# Patient Record
Sex: Male | Born: 1985 | Race: White | Hispanic: No | Marital: Single | Smoking: Never smoker
Health system: Southern US, Community
[De-identification: ages and names within clinical notes are randomized; demographics above are authoritative.]

## PROBLEM LIST (undated history)

## (undated) DIAGNOSIS — J45909 Unspecified asthma, uncomplicated: Secondary | ICD-10-CM

## (undated) HISTORY — DX: Unspecified asthma, uncomplicated: J45.909

---

## 2013-08-12 ENCOUNTER — Ambulatory Visit (INDEPENDENT_AMBULATORY_CARE_PROVIDER_SITE_OTHER): Payer: BC Managed Care – PPO | Admitting: Family Medicine

## 2013-08-12 ENCOUNTER — Encounter: Payer: Self-pay | Admitting: Family Medicine

## 2013-08-12 VITALS — BP 134/84 | HR 52 | Ht 73.0 in | Wt 199.0 lb

## 2013-08-12 DIAGNOSIS — M24559 Contracture, unspecified hip: Secondary | ICD-10-CM

## 2013-08-12 DIAGNOSIS — M545 Low back pain, unspecified: Secondary | ICD-10-CM | POA: Insufficient documentation

## 2013-08-12 DIAGNOSIS — M6240 Contracture of muscle, unspecified site: Secondary | ICD-10-CM

## 2013-08-12 DIAGNOSIS — M999 Biomechanical lesion, unspecified: Secondary | ICD-10-CM

## 2013-08-12 NOTE — Patient Instructions (Signed)
Good to meet you Ice back when hurting 20 minutes Work on hip flexor stretching and hip abductor strengthening.  Hip abductors on wall with heel and butt touching wall bring up 8 inches hold 2 seconds down slow for count of 4 repeat 30 reps daily each side, 2nd 2 sets daily of 30 3 rd week 3 sets daily.  Look at handout on hip flexor.  Vitamin D 2000 IU daily.  Come back again in 3 weeks.

## 2013-08-12 NOTE — Assessment & Plan Note (Signed)
Decision today to treat with OMT was based on Physical Exam  After verbal consent patient was treated with HVLA, ME techniques in thoracic, lumbar and sacral areas  Patient tolerated the procedure well with improvement in symptoms  Patient given exercises, stretches and lifestyle modifications  See medications in patient instructions if given  Patient will follow up in 3 weeks      

## 2013-08-12 NOTE — Assessment & Plan Note (Signed)
Patient's low back pain is secondary to muscle imbalances. Patient has tight hip flexors and weak hip abductor's. Patient given home exercises that could be beneficial and strengthening exercises for the hip abductor's. Patient will try over-the-counter medications. We discussed proper lifting techniques that could be helpful as well. Patient will try these interventions and come back again in 3 weeks.

## 2013-08-12 NOTE — Progress Notes (Signed)
  Shawn Mason D.O. [facility] [address] [place], [place] [address] Phone: [phone] Subjective:     CC: Back pain.   FAO:ZHYQMVHQIOHPI:Subjective [name] is a 28 y.o. male coming in with complaint of lower back pain. Patient has had back pain intimately of the course last 3-4 years. Patient denies any radiation to his legs or any numbness. Patient states that one time when lifting he did notice some clicking in his back and since then has had some discomfort. Patient states when lifting he does not seem to have any trouble but it seems to be worse after the lifting when he has some tightness. Patient denies epistaxis him from doing any activity but sometimes he takes an extra day off from lifting again. Patient has been doing cross fit regularly. Patient denies any fevers or chills or any abnormal weight loss.     Past medical history, social, surgical and family history all reviewed in electronic medical record.   Review of Systems: No headache, visual changes, nausea, vomiting, diarrhea, constipation, dizziness, abdominal pain, skin rash, fevers, chills, night sweats, weight loss, swollen lymph nodes, body aches, joint swelling, muscle aches, chest pain, shortness of breath, mood changes.   Objective Blood pressure 134/84, pulse 52, height 6\' 1"  (1.854 m), weight 199 lb (90.266 kg), SpO2 97.00%.  General: No apparent distress alert and oriented x3 mood and affect normal, dressed appropriately.  HEENT: Pupils equal, extraocular movements intact  Respiratory: Patient's speak in full sentences and does not appear short of breath  Cardiovascular: No lower extremity edema, non tender, no erythema  Skin: Warm dry intact with no signs of infection or rash on extremities or on axial skeleton.  Abdomen: Soft nontender  Neuro: Cranial nerves II through XII are intact, neurovascularly intact in all extremities with 2+ DTRs and 2+ pulses.  Lymph: No lymphadenopathy of posterior or anterior  cervical chain or axillae bilaterally.  Gait normal with good balance and coordination.  MSK:  Non tender with full range of motion and good stability and symmetric strength and tone of shoulders, elbows, wrist, hip, knee and ankles bilaterally.  Back Exam:  Inspection: Unremarkable  Motion: Flexion 45 deg, Extension 45 deg, Side Bending to 45 deg bilaterally,  Rotation to 45 deg bilaterally  SLR laying: Negative  XSLR laying: Negative  Palpable tenderness: Mild tenderness over the L1-L2 area bilaterally in the paraspinal musculature no midline tenderness.. FABER: negative. Sensory change: Gross sensation intact to all lumbar and sacral dermatomes.  Reflexes: 2+ at both patellar tendons, 2+ at achilles tendons, Babinski's downgoing.  Strength at foot  Plantar-flexion: 5/5 Dorsi-flexion: 5/5 Eversion: 5/5 Inversion: 5/5  Leg strength  Quad: 5/5 Hamstring: 5/5 Hip flexor: 5/5 but does have tightness of the hip flexors bilaterally Hip abductors: 3/5 bilateral Gait unremarkable.  Osteopathic findings  Thoracic T5 extended rotated and side bent left  Lumbar L1, L2 flexed rotated and side bent right  Sacrum Left on left   Impression and Recommendations:     This case required medical decision making of moderate complexity.

## 2013-09-04 ENCOUNTER — Ambulatory Visit (INDEPENDENT_AMBULATORY_CARE_PROVIDER_SITE_OTHER): Payer: BC Managed Care – PPO | Admitting: Family Medicine

## 2013-09-04 ENCOUNTER — Encounter: Payer: Self-pay | Admitting: Family Medicine

## 2013-09-04 VITALS — BP 130/84 | HR 52

## 2013-09-04 DIAGNOSIS — M545 Low back pain, unspecified: Secondary | ICD-10-CM

## 2013-09-04 DIAGNOSIS — M999 Biomechanical lesion, unspecified: Secondary | ICD-10-CM

## 2013-09-04 NOTE — Progress Notes (Signed)
  Tawana ScaleZach Mason D.O. [facility] [address] [place], [place] [address] Phone: [phone]([phone] Subjective:     CC: Back pain follow up  BJY:NWGNFAOZHYHPI:Subjective Shawn Mason is a 28 y.o. male coming in for followup of low back pain. Patient was seen previously and had more of hip flexor tightness bilaterally that was causing his back pain. Patient did respond well to osteopathic manipulation given home exercises, anti-inflammatories over-the-counter medications. Patient states he is 70% better than he was previously. Patient states that he can do a long car ride without any significant discomfort. Patient is doing the exercises about 4 times a week and notices a lot of improvement. Denies any new symptoms and is happy with the results. Patient did get vitamin D and has started taking another course of the last week.     Past medical history, social, surgical and family history all reviewed in electronic medical record.   Review of Systems: No headache, visual changes, nausea, vomiting, diarrhea, constipation, dizziness, abdominal pain, skin rash, fevers, chills, night sweats, weight loss, swollen lymph nodes, body aches, joint swelling, muscle aches, chest pain, shortness of breath, mood changes.   Objective Blood pressure 130/84, pulse 52, SpO2 99.00%.  General: No apparent distress alert and oriented x3 mood and affect normal, dressed appropriately.  HEENT: Pupils equal, extraocular movements intact  Respiratory: Patient's speak in full sentences and does not appear short of breath  Cardiovascular: No lower extremity edema, non tender, no erythema  Skin: Warm dry intact with no signs of infection or rash on extremities or on axial skeleton.  Abdomen: Soft nontender  Neuro: Cranial nerves II through XII are intact, neurovascularly intact in all extremities with 2+ DTRs and 2+ pulses.  Lymph: No lymphadenopathy of posterior or anterior cervical chain or axillae bilaterally.  Gait normal  with good balance and coordination.  MSK:  Non tender with full range of motion and good stability and symmetric strength and tone of shoulders, elbows, wrist, hip, knee and ankles bilaterally.  Back Exam:  Inspection: Unremarkable  Motion: Flexion 45 deg, Extension 45 deg, Side Bending to 45 deg bilaterally,  Rotation to 45 deg bilaterally  SLR laying: Negative  XSLR laying: Negative  Palpable tenderness: Mild tenderness over the L1-L2 area \] on right but significantly better than previous exam. FABER: negative. Sensory change: Gross sensation intact to all lumbar and sacral dermatomes.  Reflexes: 2+ at both patellar tendons, 2+ at achilles tendons, Babinski's downgoing.  Strength at foot  Plantar-flexion: 5/5 Dorsi-flexion: 5/5 Eversion: 5/5 Inversion: 5/5  Leg strength  Quad: 5/5 Hamstring: 5/5 Hip flexor: 5/5 but does have tightness of the hip flexors bilaterally Hip abductors: 4/5 bilateral which is improved Gait unremarkable.  Osteopathic findings  Thoracic T5 extended rotated and side bent left  Lumbar L2 flexed rotated and side bent right  Sacrum Left on left   Impression and Recommendations:     This case required medical decision making of moderate complexity.

## 2013-09-04 NOTE — Assessment & Plan Note (Signed)
She is doing extremely better. Patient is doing the exercises on a regular basis which we can tell based on his increase strength of the hip abductor's and less tightness of the hip flexors. Patient will continue with current therapy. Patient will come back again in 5 weeks for further evaluation and likely manipulation.

## 2013-09-04 NOTE — Patient Instructions (Signed)
Good to see you Exercises 3 times a week  Continue all meds. See me in 5 weeks.

## 2013-09-04 NOTE — Assessment & Plan Note (Signed)
Decision today to treat with OMT was based on Physical Exam  After verbal consent patient was treated with HVLA, ME techniques in thoracic, lumbar and sacral areas  Patient tolerated the procedure well with improvement in symptoms  Patient given exercises, stretches and lifestyle modifications  See medications in patient instructions if given  Patient will follow up in 5 weeks    

## 2013-10-09 ENCOUNTER — Encounter: Payer: Self-pay | Admitting: Family Medicine

## 2013-10-09 ENCOUNTER — Ambulatory Visit (INDEPENDENT_AMBULATORY_CARE_PROVIDER_SITE_OTHER): Payer: BC Managed Care – PPO | Admitting: Family Medicine

## 2013-10-09 VITALS — BP 124/80 | HR 58 | Ht 73.0 in | Wt 201.0 lb

## 2013-10-09 DIAGNOSIS — M545 Low back pain, unspecified: Secondary | ICD-10-CM

## 2013-10-09 DIAGNOSIS — M999 Biomechanical lesion, unspecified: Secondary | ICD-10-CM

## 2013-10-09 NOTE — Assessment & Plan Note (Signed)
Decision today to treat with OMT was based on Physical Exam  After verbal consent patient was treated with HVLA, ME techniques in thoracic, lumbar and sacral areas  Patient tolerated the procedure well with improvement in symptoms  Patient given exercises, stretches and lifestyle modifications  See medications in patient instructions if given  Patient will follow up in 6 weeks      

## 2013-10-09 NOTE — Progress Notes (Signed)
  [name] [address] [place], [place] [address] Phone: [phone] Subjective:     CC: Back pain follow up  VFI:EPPIRJJOAC DASHEL Shawn Mason is a 28 y.o. male coming in for followup of low back pain. Patient was seen previously and had more of hip flexor tightness bilaterally that was causing his back pain. Patient did respond well to osteopathic manipulation given home exercises, anti-inflammatories over-the-counter medications. Patient was given phase II exercises he was working on some strengthening exercises after last visit. Patient states overall he is doing significantly better. Patient given home exercise program. Patient denies any new symptoms. Patient states he is having some mild tightness of the last couple days but otherwise no significant pain or discomfort. Resting comfortably.    Past medical history, social, surgical and family history all reviewed in electronic medical record.   Review of Systems: No headache, visual changes, nausea, vomiting, diarrhea, constipation, dizziness, abdominal pain, skin rash, fevers, chills, night sweats, weight loss, swollen lymph nodes, body aches, joint swelling, muscle aches, chest pain, shortness of breath, mood changes.   Objective Blood pressure 124/80, pulse 58, height 6\' 1"  (1.854 m), weight 201 lb (91.173 kg), SpO2 99.00%.  General: No apparent distress alert and oriented x3 mood and affect normal, dressed appropriately.  HEENT: Pupils equal, extraocular movements intact  Respiratory: Patient's speak in full sentences and does not appear short of breath  Cardiovascular: No lower extremity edema, non tender, no erythema  Skin: Warm dry intact with no signs of infection or rash on extremities or on axial skeleton.  Abdomen: Soft nontender  Neuro: Cranial nerves II through XII are intact, neurovascularly intact in all extremities with 2+ DTRs and 2+ pulses.  Lymph: No lymphadenopathy of posterior or  anterior cervical chain or axillae bilaterally.  Gait normal with good balance and coordination.  MSK:  Non tender with full range of motion and good stability and symmetric strength and tone of shoulders, elbows, wrist, hip, knee and ankles bilaterally.  Back Exam:  Inspection: Unremarkable  Motion: Flexion 45 deg, Extension 45 deg, Side Bending to 45 deg bilaterally,  Rotation to 45 deg bilaterally  SLR laying: Negative  XSLR laying: Negative  Palpable tenderness: Mild tenderness over the L1-L2 area  on right but significantly better than previous exam. FABER: negative. Sensory change: Gross sensation intact to all lumbar and sacral dermatomes.  Reflexes: 2+ at both patellar tendons, 2+ at achilles tendons, Babinski's downgoing.  Strength at foot  Plantar-flexion: 5/5 Dorsi-flexion: 5/5 Eversion: 5/5 Inversion: 5/5  Leg strength  Quad: 5/5 Hamstring: 5/5 Hip flexor: 5/5 but does have tightness of the hip flexors bilaterally Hip abductors: 5 out of 5 Gait unremarkable.  Osteopathic findings  Cervical C2 flexed rotated inside that right  Thoracic T5 extended rotated and side bent left  Lumbar L2 flexed rotated and side bent right  Sacrum Left on left   Impression and Recommendations:     This case required medical decision making of moderate complexity.

## 2013-10-09 NOTE — Assessment & Plan Note (Signed)
Patient is doing remarkably well and has been working on the muscle imbalances. Patient encouraged to continue the exercises fairly regularly. We discussed over-the-counter medications that could be beneficial. Patient is to continue with manipulation as long as it continues to help her we'll increase the duration and decrease the frequency. Patient will follow up again in 5-6 weeks for further evaluation and treatment.  Spent greater than 25 minutes with patient face-to-face and had greater than 50% of counseling including as described above in assessment and plan.

## 2013-10-09 NOTE — Patient Instructions (Signed)
Good to see you Continue the exercises 3 times a week.  Make sure after working out to eat within 30 minutes. 3:1 ratio of carbs to protein. See me again in 6 weeks.

## 2013-11-27 ENCOUNTER — Ambulatory Visit (INDEPENDENT_AMBULATORY_CARE_PROVIDER_SITE_OTHER): Payer: BC Managed Care – PPO | Admitting: Family Medicine

## 2013-11-27 ENCOUNTER — Encounter: Payer: Self-pay | Admitting: Family Medicine

## 2013-11-27 VITALS — BP 132/84 | HR 44 | Ht 73.0 in | Wt 201.0 lb

## 2013-11-27 DIAGNOSIS — M999 Biomechanical lesion, unspecified: Secondary | ICD-10-CM

## 2013-11-27 DIAGNOSIS — M545 Low back pain, unspecified: Secondary | ICD-10-CM

## 2013-11-27 DIAGNOSIS — M7702 Medial epicondylitis, left elbow: Secondary | ICD-10-CM

## 2013-11-27 DIAGNOSIS — M7700 Medial epicondylitis, unspecified elbow: Secondary | ICD-10-CM

## 2013-11-27 NOTE — Assessment & Plan Note (Signed)
Patient is doing markedly better. Patient is really having no pain at baseline. Patient can follow up on an as-needed basis for any other osteopathic manipulation therapy. Patient will continue home exercises 3 times a week and continue the over-the-counter medications. Patient will followup like I said previously on an as-needed basis.

## 2013-11-27 NOTE — Assessment & Plan Note (Signed)
Decision today to treat with OMT was based on Physical Exam  After verbal consent patient was treated with HVLA, ME techniques in thoracic, lumbar and sacral areas  Patient tolerated the procedure well with improvement in symptoms  Patient given exercises, stretches and lifestyle modifications  See medications in patient instructions if given  Patient will follow up in 6-8 weeks    

## 2013-11-27 NOTE — Patient Instructions (Signed)
Good to see you as always.  Too easy! Keep up the nutrition For your elbow look at handout and exercises 3 times a week Drop weight to 50% and increase 15% a week for next 3-4 weeks.  Ice after activity for 20 minutes Wear a compression sleeve with activity and 30 minutes afterward, look at [employer] or [employer] Try topical medicine after activity as well.  Come back in 4-6 weeks to check the elbow again

## 2013-11-27 NOTE — Assessment & Plan Note (Signed)
Patient is a golfer's elbow the left elbow. Patient at home exercise, icing protocol, discussed compression as well as topical anti-inflammatories. Patient will try these interventions as well as a home exercise program. Patient will limit weight lifting to 50% increase slowly 50% a week. Patient will follow up in 4-6 weeks.

## 2013-11-27 NOTE — Progress Notes (Signed)
Tawana ScaleZach Karrine Kluttz D.O. [facility] [address] [place], [place] [address] Phone: [phone]([phone] Subjective:     CC: Back pain follow up  NFA:OZHYQMVHQIHPI:Subjective [name] is a 28 y.o. male coming in for followup of low back pain. Patient was seen previously and had more of hip flexor tightness bilaterally that was causing his back pain. Patient did respond well to osteopathic manipulation given home exercises, anti-inflammatories over-the-counter medications. Patient was given phase II exercises he was working on some strengthening exercises after last visit. Patient states overall he is doing significantly better. We also discussed changing post workout nutrition at last visit and hydration. Patient given changes and overall is feeling better.  Patient does come in with a new problem. Patient is describing some left elbow pain. States it is more the medial aspect. Worse after playing golf. Denies any radiation of the arm or any numbness or weakness. Patient states it is painful especially when he tries to lift certain weights. Not started draining readily daily activities. Pain is 6/10 in severity.    Past medical history, social, surgical and family history all reviewed in electronic medical record.   Review of Systems: No headache, visual changes, nausea, vomiting, diarrhea, constipation, dizziness, abdominal pain, skin rash, fevers, chills, night sweats, weight loss, swollen lymph nodes, body aches, joint swelling, muscle aches, chest pain, shortness of breath, mood changes.   Objective There were no vitals taken for this visit.  General: No apparent distress alert and oriented x3 mood and affect normal, dressed appropriately.  HEENT: Pupils equal, extraocular movements intact  Respiratory: Patient's speak in full sentences and does not appear short of breath  Cardiovascular: No lower extremity edema, non tender, no erythema  Skin: Warm dry intact with no signs of infection or rash on  extremities or on axial skeleton.  Abdomen: Soft nontender  Neuro: Cranial nerves II through XII are intact, neurovascularly intact in all extremities with 2+ DTRs and 2+ pulses.  Lymph: No lymphadenopathy of posterior or anterior cervical chain or axillae bilaterally.  Gait normal with good balance and coordination.  MSK:  Non tender with full range of motion and good stability and symmetric strength and tone of shoulders,  wrist, hip, knee and ankles bilaterally.  Elbow: Left Unremarkable to inspection. Range of motion full pronation, supination, flexion, extension. Strength is full to all of the above directions Stable to varus, valgus stress. Negative moving valgus stress test. Mild tenderness to palpation over the medial epicondylar region Ulnar nerve does not sublux. Negative cubital tunnel Tinel's. Contralateral elbow unremarkable Back Exam:  Inspection: Unremarkable  Motion: Flexion 45 deg, Extension 45 deg, Side Bending to 45 deg bilaterally,  Rotation to 45 deg bilaterally  SLR laying: Negative  XSLR laying: Negative  Palpable tenderness: Mild tenderness over the L1-L2 area  on right but significantly better than previous exam. FABER: negative. Sensory change: Gross sensation intact to all lumbar and sacral dermatomes.  Reflexes: 2+ at both patellar tendons, 2+ at achilles tendons, Babinski's downgoing.  Strength at foot  Plantar-flexion: 5/5 Dorsi-flexion: 5/5 Eversion: 5/5 Inversion: 5/5  Leg strength  Quad: 5/5 Hamstring: 5/5 Hip flexor: 5/5 but does have tightness of the hip flexors bilaterally Hip abductors: 5 out of 5 Gait unremarkable.  Osteopathic findings  Cervical C2 flexed rotated inside that right  Thoracic T5 extended rotated and side bent left  Lumbar L2 flexed rotated and side bent right  Sacrum Left on left   Impression and Recommendations:  This case required medical decision making of moderate complexity.

## 2014-01-01 ENCOUNTER — Ambulatory Visit (INDEPENDENT_AMBULATORY_CARE_PROVIDER_SITE_OTHER): Payer: BC Managed Care – PPO | Admitting: Family Medicine

## 2014-01-01 ENCOUNTER — Encounter: Payer: Self-pay | Admitting: Family Medicine

## 2014-01-01 VITALS — BP 108/80 | HR 50 | Ht 73.0 in | Wt 198.0 lb

## 2014-01-01 DIAGNOSIS — M545 Low back pain, unspecified: Secondary | ICD-10-CM

## 2014-01-01 DIAGNOSIS — M6240 Contracture of muscle, unspecified site: Secondary | ICD-10-CM

## 2014-01-01 DIAGNOSIS — M24559 Contracture, unspecified hip: Secondary | ICD-10-CM

## 2014-01-01 DIAGNOSIS — M999 Biomechanical lesion, unspecified: Secondary | ICD-10-CM

## 2014-01-01 NOTE — Assessment & Plan Note (Signed)
Patient back pain seems to be stable overall. I do not think he has worsening though we are not making significant improvement either. Patient continues with the osteopathic manipulation. We discussed continuing home exercises, icing, we discussed proper seated position that could be helpful. We discussed continuing the over-the-counter medications. No imaging is necessary at this time. Patient will increase the duration between visits and will come back in 5-6 weeks.  Spent greater than 25 minutes with patient face-to-face and had greater than 50% of counseling including as described above in assessment and plan.

## 2014-01-01 NOTE — Assessment & Plan Note (Signed)
Discussed with patient again at length. Patient will continue with the stretching. We discussed the importance of proper technique. About the possibility of formal physical therapy which patient declined.

## 2014-01-01 NOTE — Assessment & Plan Note (Signed)
Decision today to treat with OMT was based on Physical Exam  After verbal consent patient was treated with HVLA, ME techniques in thoracic, lumbar and sacral areas  Patient tolerated the procedure well with improvement in symptoms  Patient given exercises, stretches and lifestyle modifications  See medications in patient instructions if given  Patient will follow up in 5-6 weeks    

## 2014-01-01 NOTE — Progress Notes (Signed)
[name] [address] [place], [place] [address] Phone: [phone] Subjective:     CC: Back pain follow up  [id number] Shawn Mason is a 28 y.o. male coming in for followup of low back pain. Patient back pain has been multifactorial including tightness as well as muscle imbalances. Patient has been working on stretching the hip flexors as well as strengthening the hip abductors. Patient has responded well to osteopathic manipulation in the past as well. We have tried changing position at work. Patient states he is doing relatively well. Patient has been able to increase his weights and does not have any pain. Patient though though some discomfort the day after heavy lifting. Otherwise no new symptoms. No nighttime awakening.  Patient also at last visit did have what appeared to be medial epicondyle it is. Patient was given home exercise program, icing protocol, we discussed the possibility of bracing. Patient changed his grip with lifting. Patient states that the pain is almost completely resolved. Patient has some mild discomfort but as long as he wears a compression sleeve with lifting as well as icing afterwards he has no pain the next day. Denies any numbness or weakness. Patient is happy with the results.    Past medical history, social, surgical and family history all reviewed in electronic medical record.   Review of Systems: No headache, visual changes, nausea, vomiting, diarrhea, constipation, dizziness, abdominal pain, skin rash, fevers, chills, night sweats, weight loss, swollen lymph nodes, body aches, joint swelling, muscle aches, chest pain, shortness of breath, mood changes.   Objective Blood pressure 108/80, pulse 50, height  (1.854 m), weight 198 lb (89.812 kg), SpO2 98.00%.  General: No apparent distress alert and oriented x3 mood and affect normal, dressed appropriately.  HEENT: Pupils equal, extraocular movements intact    Respiratory: Patient's speak in full sentences and does not appear short of breath  Cardiovascular: No lower extremity edema, non tender, no erythema  Skin: Warm dry intact with no signs of infection or rash on extremities or on axial skeleton.  Abdomen: Soft nontender  Neuro: Cranial nerves II through XII are intact, neurovascularly intact in all extremities with 2+ DTRs and 2+ pulses.  Lymph: No lymphadenopathy of posterior or anterior cervical chain or axillae bilaterally.  Gait normal with good balance and coordination.  MSK:  Non tender with full range of motion and good stability and symmetric strength and tone of shoulders,  wrist, hip, knee and ankles bilaterally.  Elbow: Left Unremarkable to inspection. Range of motion full pronation, supination, flexion, extension. Strength is full to all of the above directions Stable to varus, valgus stress. Negative moving valgus stress test. Nontender on exam Ulnar nerve does not sublux. Negative cubital tunnel Tinel's. Contralateral elbow unremarkable Back Exam:  Inspection: Unremarkable  Motion: Flexion 45 deg, Extension 45 deg, Side Bending to 45 deg bilaterally,  Rotation to 45 deg bilaterally  SLR laying: Negative  XSLR laying: Negative  Palpable tenderness: Mild tenderness over the L1-L2 area  on right but significantly better than previous exam. FABER: negative. Sensory change: Gross sensation intact to all lumbar and sacral dermatomes.  Reflexes: 2+ at both patellar tendons, 2+ at achilles tendons, Babinski's downgoing.  Strength at foot  Plantar-flexion: 5/5 Dorsi-flexion: 5/5 Eversion: 5/5 Inversion: 5/5  Leg strength  Quad: 5/5 Hamstring: 5/5 Hip flexor: 5/5 with mild improvement in range of motion Hip abductors: 5 out of 5 Gait unremarkable.  Osteopathic findings  Cervical C2 flexed  rotated inside that right C4 flexed rotated and side bent left  Thoracic T5 extended rotated and side bent left  Lumbar L2 flexed  rotated and side bent right  Sacrum Left on left   Impression and Recommendations:     This case required medical decision making of moderate complexity.

## 2014-01-01 NOTE — Patient Instructions (Addendum)
It is good to see you.  Continue the exercises overall.  For the elbow continue the compression and icing afterward.  Otherwise do what you want the tear is healed.  Your back is great overall.  Try to incorporate maybe ankle weights into hip abductors to get more resistance.  Lets space out to 6 weeks.

## 2014-02-05 ENCOUNTER — Encounter: Payer: Self-pay | Admitting: Family Medicine

## 2014-02-05 ENCOUNTER — Ambulatory Visit (INDEPENDENT_AMBULATORY_CARE_PROVIDER_SITE_OTHER): Payer: BC Managed Care – PPO | Admitting: Family Medicine

## 2014-02-05 VITALS — BP 130/80 | HR 80 | Temp 98.4°F | Ht 73.0 in | Wt 197.4 lb

## 2014-02-05 DIAGNOSIS — M999 Biomechanical lesion, unspecified: Secondary | ICD-10-CM

## 2014-02-05 DIAGNOSIS — M545 Low back pain, unspecified: Secondary | ICD-10-CM

## 2014-02-05 DIAGNOSIS — M7700 Medial epicondylitis, unspecified elbow: Secondary | ICD-10-CM

## 2014-02-05 DIAGNOSIS — M7702 Medial epicondylitis, left elbow: Secondary | ICD-10-CM

## 2014-02-05 MED ORDER — NITROGLYCERIN 0.2 MG/HR TD PT24: MEDICATED_PATCH | TRANSDERMAL | Status: DC

## 2014-02-05 NOTE — Assessment & Plan Note (Signed)
Decision today to treat with OMT was based on Physical Exam  After verbal consent patient was treated with HVLA, ME techniques in thoracic, lumbar and sacral areas  Patient tolerated the procedure well with improvement in symptoms  Patient given exercises, stretches and lifestyle modifications  See medications in patient instructions if given  Patient will follow up in 4 weeks    

## 2014-02-05 NOTE — Patient Instructions (Signed)
Good to se eyou For your elbow try nitroglycerin and keep message going.  Ice after working out.  Think of lifting more with thumbs up or overhand grip Nitroglycerin Protocol   Apply 1/4 nitroglycerin patch to affected area daily.  Change position of patch within the affected area every 24 hours.  You may experience a headache during the first 1-2 weeks of using the patch, these should subside.  If you experience headaches after beginning nitroglycerin patch treatment, you may take your preferred over the counter pain reliever.  Another side effect of the nitroglycerin patch is skin irritation or rash related to patch adhesive.  Please notify our office if you develop more severe headaches or rash, and stop the patch.  Tendon healing with nitroglycerin patch may require 12 to 24 weeks depending on the extent of injury.  Men should not use if taking Viagra, Cialis, or Levitra.   Do not use if you have migraines or rosacea.   See me again in 4 weeks.

## 2014-02-05 NOTE — Assessment & Plan Note (Signed)
Patient continues to respond well with his low back pain. I do think he has tight hip flexors are weak hip abductors compared to his strength relatively. Encourage patient to continue to do some stretching of the radial basis. We discussed continuing the over-the-counter medication. Patient is going to avoid certain extension type exercises it can increase the lordosis of his lumbar spine. We discussed proper lifting technique on multiple different meds. Patient and will also limit the number of days that he does powerless. Patient and will come back and see me again in 4 weeks for further evaluation and treatment.

## 2014-02-05 NOTE — Progress Notes (Signed)
[name] [address] [place], [place] [address] Phone: [phone] Subjective:     CC: Back pain follow up, left elbow followup  BJY:NWGNFAOZHY Shawn Mason is a 28 y.o. male coming in for followup of low back pain. Patient back pain has been multifactorial including tightness as well as muscle imbalances. Patient continues to be able to lift significant amount of weight. Patient states that when he does more lifting he does have some mild increase in exaggeration of the back pain. Patient though denies any radiation down the legs any numbness. Patient states that as long as he does his exercises and stretches of her lower back he does well. Patient continues with the over-the-counter medications.   Patient also at last visit did have what appeared to be medial epicondylitis. Patient continues to try to do well with conservative therapy but still has some nagging dull aching sensation on the medial aspect of his elbow. Patient states that she doesn't feel she has made significant improvement over the course of time. Patient is wondering if there is anything else we can do.    Past medical history, social, surgical and family history all reviewed in electronic medical record.   Review of Systems: No headache, visual changes, nausea, vomiting, diarrhea, constipation, dizziness, abdominal pain, skin rash, fevers, chills, night sweats, weight loss, swollen lymph nodes, body aches, joint swelling, muscle aches, chest pain, shortness of breath, mood changes.   Objective Blood pressure 130/80, pulse 80, temperature 98.4 F (36.9 C), temperature source Oral, height 6\' 1"  (1.854 m), weight 197 lb 6.4 oz (89.54 kg), SpO2 97.00%.  General: No apparent distress alert and oriented x3 mood and affect normal, dressed appropriately.  HEENT: Pupils equal, extraocular movements intact  Respiratory: Patient's speak in full sentences and does not appear short of breath    Cardiovascular: No lower extremity edema, non tender, no erythema  Skin: Warm dry intact with no signs of infection or rash on extremities or on axial skeleton.  Abdomen: Soft nontender  Neuro: Cranial nerves II through XII are intact, neurovascularly intact in all extremities with 2+ DTRs and 2+ pulses.  Lymph: No lymphadenopathy of posterior or anterior cervical chain or axillae bilaterally.  Gait normal with good balance and coordination.  MSK:  Non tender with full range of motion and good stability and symmetric strength and tone of shoulders,  wrist, hip, knee and ankles bilaterally.  Elbow: Left Unremarkable to inspection. Range of motion full pronation, supination, flexion, extension. Strength is full to all of the above directions Stable to varus, valgus stress. Negative moving valgus stress test. Still minimally tender to palpation over the medial epicondylar region. Ulnar nerve does not sublux. Negative cubital tunnel Tinel's. Contralateral elbow unremarkable Back Exam:  Inspection: Unremarkable  Motion: Flexion 45 deg, Extension 45 deg, Side Bending to 45 deg bilaterally,  Rotation to 45 deg bilaterally  SLR laying: Negative  XSLR laying: Negative  Palpable tenderness: Mild tenderness over the L1-L2 area  on right but significantly better than previous exam. FABER: negative. Sensory change: Gross sensation intact to all lumbar and sacral dermatomes.  Reflexes: 2+ at both patellar tendons, 2+ at achilles tendons, Babinski's downgoing.  Strength at foot  Plantar-flexion: 5/5 Dorsi-flexion: 5/5 Eversion: 5/5 Inversion: 5/5  Leg strength  Quad: 5/5 Hamstring: 5/5 Hip flexor: 5/5 with mild improvement in range of motion Hip abductors: 5 out of 5 Gait unremarkable.  Osteopathic findings  Cervical C2 flexed rotated inside that right  C4 flexed rotated and side bent left  Thoracic T5 extended rotated and side bent left  Lumbar L2 flexed rotated and side bent  right  Sacrum Left on left   Impression and Recommendations:     This case required medical decision making of moderate complexity.

## 2014-02-05 NOTE — Assessment & Plan Note (Signed)
Patient continues to have difficulty with the golfer's elbow. We discussed proper lifting technique and avoiding significant forearm flexors that is contributing to this pain. We discussed possibly doing a compression sleeve and patient was given nitroglycerin and warned of potential side effects. We discussed an icing regimen. Patient will try to make these different changes and will try the medication and will follow up in 4 weeks for further evaluation.

## 2014-02-05 NOTE — Progress Notes (Signed)
Pre visit review using our clinic review tool, if applicable. No additional management support is needed unless otherwise documented below in the visit note. 

## 2014-03-05 ENCOUNTER — Ambulatory Visit (INDEPENDENT_AMBULATORY_CARE_PROVIDER_SITE_OTHER): Payer: BC Managed Care – PPO | Admitting: Family Medicine

## 2014-03-05 ENCOUNTER — Encounter: Payer: Self-pay | Admitting: Family Medicine

## 2014-03-05 VITALS — BP 118/78 | HR 54 | Ht 73.0 in | Wt 197.0 lb

## 2014-03-05 DIAGNOSIS — M9904 Segmental and somatic dysfunction of sacral region: Secondary | ICD-10-CM

## 2014-03-05 DIAGNOSIS — M9903 Segmental and somatic dysfunction of lumbar region: Secondary | ICD-10-CM

## 2014-03-05 DIAGNOSIS — M9902 Segmental and somatic dysfunction of thoracic region: Secondary | ICD-10-CM

## 2014-03-05 DIAGNOSIS — M7702 Medial epicondylitis, left elbow: Secondary | ICD-10-CM

## 2014-03-05 DIAGNOSIS — M545 Low back pain, unspecified: Secondary | ICD-10-CM

## 2014-03-05 DIAGNOSIS — M999 Biomechanical lesion, unspecified: Secondary | ICD-10-CM

## 2014-03-05 DIAGNOSIS — M24559 Contracture, unspecified hip: Secondary | ICD-10-CM

## 2014-03-05 NOTE — Progress Notes (Signed)
Shawn Mason D.O. [facility] [address] [place], [place] [address] Phone: [phone]) [phone] Subjective:     CC: Back pain follow up, left elbow followup  QIO:[id number]:Subjective Shawn Mason is a 28 y.o. male coming in for followup of low back pain. Patient back pain has been multifactorial including tightness as well as muscle imbalances. Patient continues to be able to lift significant amount of weight. Patient has been doing a lot more squats and has noticed some mild increase in pain. Denies any radiation down his legs and any numbness. Patient states that this does not affect his daily activities. States that it can be sore at the end of the day. Does not keep him up at night. Denies any fevers or chills or any abnormal weight loss.   Patient also at last visit did have what appeared to be medial epicondylitis. Patient continues to try to do well with conservative therapy but still has some nagging dull aching sensation on the medial aspect of his elbow. Patient was put on nitroglycerin at last visit and continued with conservative therapy. Patient states he has made improvement. Patient states he only has some soreness when he starts lifting but overall does not notice any significant pain that stops him from any activity. No pain at all with any regular daily activities. Only pain with full extension of the elbow. No weakness noted.    Past medical history, social, surgical and family history all reviewed in electronic medical record.   Review of Systems: No headache, visual changes, nausea, vomiting, diarrhea, constipation, dizziness, abdominal pain, skin rash, fevers, chills, night sweats, weight loss, swollen lymph nodes, body aches, joint swelling, muscle aches, chest pain, shortness of breath, mood changes.   Objective Blood pressure 118/78, pulse 54, height 6\' 1"  (1.854 m), weight 197 lb (89.359 kg), SpO2 98.00%.  General: No apparent distress alert and oriented x3 mood and  affect normal, dressed appropriately.  HEENT: Pupils equal, extraocular movements intact  Respiratory: Patient's speak in full sentences and does not appear short of breath  Cardiovascular: No lower extremity edema, non tender, no erythema  Skin: Warm dry intact with no signs of infection or rash on extremities or on axial skeleton.  Abdomen: Soft nontender  Neuro: Cranial nerves II through XII are intact, neurovascularly intact in all extremities with 2+ DTRs and 2+ pulses.  Lymph: No lymphadenopathy of posterior or anterior cervical chain or axillae bilaterally.  Gait normal with good balance and coordination.  MSK:  Non tender with full range of motion and good stability and symmetric strength and tone of shoulders,  wrist, hip, knee and ankles bilaterally.  Elbow: Left Unremarkable to inspection. Range of motion full pronation, supination, flexion, extension. Strength is full to all of the above directions Stable to varus, valgus stress. Negative moving valgus stress test. Nontender on exam over the medial epicondylar region. Ulnar nerve does not sublux. Negative cubital tunnel Tinel's. Contralateral elbow unremarkable Back Exam:  Inspection: Unremarkable  Motion: Flexion 45 deg, Extension 45 deg, Side Bending to 45 deg bilaterally,  Rotation to 45 deg bilaterally  SLR laying: Negative  XSLR laying: Negative  Palpable tenderness: Mild tenderness over the L1-L2 area  on right but significantly better than previous exam. FABER: negative. Sensory change: Gross sensation intact to all lumbar and sacral dermatomes.  Reflexes: 2+ at both patellar tendons, 2+ at achilles tendons, Babinski's downgoing.  Strength at foot  Plantar-flexion: 5/5 Dorsi-flexion: 5/5 Eversion: 5/5 Inversion: 5/5  Leg strength  Quad: 5/5 Hamstring: 5/5 Hip flexor: 5/5 with mild improvement in range of motion Hip abductors: 5 out of 5 Gait unremarkable.  Osteopathic findings  Cervical C2 flexed rotated  inside that right C4 flexed rotated and side bent left  Thoracic T3 extended rotated and side bent left T7 extended rotated and side bent right  Lumbar L2 flexed rotated and side bent right  Sacrum Left on left   Impression and Recommendations:     This case required medical decision making of moderate complexity.

## 2014-03-05 NOTE — Assessment & Plan Note (Signed)
Still significantly tight. Encourage patient to do the exercises on a more regular basis. She patient proper technique and was well today. Return to office in 6 weeks

## 2014-03-05 NOTE — Assessment & Plan Note (Signed)
On nitroglycerin at this time. Patient is making good improvement. We discussed the possibility of compression. Patient is not having this affect his regular daily activities or waking up at night and denies any radiation of pain or any numbness.  We'll continue with conservative therapy including icing, home exercises, compression and the nitroglycerin. Patient will follow-up in 6 weeks. Continuing to have pain we can always consider cortical steroid injection or PRP. At follow-up if continuing have pain over the ultrasound the elbow.

## 2014-03-05 NOTE — Assessment & Plan Note (Signed)
Decision today to treat with OMT was based on Physical Exam  After verbal consent patient was treated with HVLA, ME techniques in thoracic, lumbar and sacral areas  Patient tolerated the procedure well with improvement in symptoms  Patient given exercises, stretches and lifestyle modifications  See medications in patient instructions if given  Patient will follow up in 6 weeks      

## 2014-03-05 NOTE — Patient Instructions (Signed)
Good to see you You are dong great For the elbow continue the nitro daily and likely will take another 6 weeks.  Back is great just tight hip flexors still.  Keep focusing on stretching  You are doing great lets say 6 weeks.

## 2014-03-05 NOTE — Assessment & Plan Note (Signed)
Patient's low back pain is still multifactorial being more muscle imbalances and patient's continuing to do exercises. Patient has made different nutritional changes. We discussed other changes can be made to his regular workout routines and talked about specific technique. Showed patient examples proper technique as well. Patient then will continue with the conservative therapy and home exercises and we'll see patient back again in 6 weeks for further evaluation and treatment.

## 2014-04-14 ENCOUNTER — Ambulatory Visit (INDEPENDENT_AMBULATORY_CARE_PROVIDER_SITE_OTHER): Payer: BC Managed Care – PPO | Admitting: Family Medicine

## 2014-04-14 ENCOUNTER — Encounter: Payer: Self-pay | Admitting: Family Medicine

## 2014-04-14 VITALS — BP 110/80 | HR 52 | Ht 73.0 in | Wt 199.0 lb

## 2014-04-14 DIAGNOSIS — M999 Biomechanical lesion, unspecified: Secondary | ICD-10-CM

## 2014-04-14 DIAGNOSIS — M9904 Segmental and somatic dysfunction of sacral region: Secondary | ICD-10-CM

## 2014-04-14 DIAGNOSIS — M545 Low back pain, unspecified: Secondary | ICD-10-CM

## 2014-04-14 DIAGNOSIS — M9902 Segmental and somatic dysfunction of thoracic region: Secondary | ICD-10-CM

## 2014-04-14 DIAGNOSIS — M9903 Segmental and somatic dysfunction of lumbar region: Secondary | ICD-10-CM

## 2014-04-14 NOTE — Patient Instructions (Signed)
Good to see you as always.  Have fun with driving.  [employer] out [employer]. Happy Holidays! See me when you need me.

## 2014-04-14 NOTE — Assessment & Plan Note (Signed)
Decision today to treat with OMT was based on Physical Exam  After verbal consent patient was treated with HVLA, ME techniques in thoracic, lumbar and sacral areas  Patient tolerated the procedure well with improvement in symptoms  Patient given exercises, stretches and lifestyle modifications  See medications in patient instructions if given  Patient will follow up in 6 weeks      

## 2014-04-14 NOTE — Assessment & Plan Note (Signed)
Continues just to be muscle imbalances. Patient continues to respond extremely well to conservative therapy and manipulation therapy. Patient doing so well we'll consider actually spacing of the frequency of appointments. We discussed proper lifting techniques again. We discussed proper stretching techniques. Patient as well as a continues to do well we'll follow up with me again in 6 weeks.

## 2014-04-14 NOTE — Progress Notes (Signed)
  Tawana ScaleZach Mason D.O. [facility] [address] [place], [place] [address] Phone: [phone](336) [phone] Subjective:     CC: Back pain follow up, left elbow followup  BJY:NWGNFAOZHYHPI:Subjective [name] is a 28 y.o. male coming in for followup of low back pain. Patient back pain has been multifactorial including tightness as well as muscle imbalances. Patient does work out regularly. Patient denies any pain that stops him from any activity at this time. Patient states overall his back seems to be going well. A little concerned because patient will be traveling for the next couple weeks. This will take him out of his regular routine and stretches.   Patient also at last visit did have what appeared to be medial epicondylitis. No longer giving him any pain.   Past medical history, social, surgical and family history all reviewed in electronic medical record.   Review of Systems: No headache, visual changes, nausea, vomiting, diarrhea, constipation, dizziness, abdominal pain, skin rash, fevers, chills, night sweats, weight loss, swollen lymph nodes, body aches, joint swelling, muscle aches, chest pain, shortness of breath, mood changes.   Objective Blood pressure 110/80, pulse 52, height 6\' 1"  (1.854 m), weight 199 lb (90.266 kg), SpO2 98 %.  General: No apparent distress alert and oriented x3 mood and affect normal, dressed appropriately.  HEENT: Pupils equal, extraocular movements intact  Respiratory: Patient's speak in full sentences and does not appear short of breath  Cardiovascular: No lower extremity edema, non tender, no erythema  Skin: Warm dry intact with no signs of infection or rash on extremities or on axial skeleton.  Abdomen: Soft nontender  Neuro: Cranial nerves II through XII are intact, neurovascularly intact in all extremities with 2+ DTRs and 2+ pulses.  Lymph: No lymphadenopathy of posterior or anterior cervical chain or axillae bilaterally.  Gait normal with good balance and  coordination.  MSK:  Non tender with full range of motion and good stability and symmetric strength and tone of shoulders,  wrist, hip, knee and ankles bilaterally.  Elbow: Left Unremarkable to inspection. Range of motion full pronation, supination, flexion, extension. Strength is full to all of the above directions Stable to varus, valgus stress. Negative moving valgus stress test. Nontender on exam over the medial epicondylar region. Ulnar nerve does not sublux. Negative cubital tunnel Tinel's. Contralateral elbow unremarkable Back Exam:  Inspection: Unremarkable  Motion: Flexion 45 deg, Extension 35 deg, Side Bending to 35 deg bilaterally,  Rotation to 45 deg bilaterally  SLR laying: Negative  XSLR laying: Negative  Palpable tenderness: Mild tenderness over the L1-L2 area  on right but significantly better than previous exam. FABER: negative. Sensory change: Gross sensation intact to all lumbar and sacral dermatomes.  Reflexes: 2+ at both patellar tendons, 2+ at achilles tendons, Babinski's downgoing.  Strength at foot  Plantar-flexion: 5/5 Dorsi-flexion: 5/5 Eversion: 5/5 Inversion: 5/5  Leg strength  Quad: 5/5 Hamstring: 5/5 Hip flexor: 5/5 with mild improvement in range of motion Hip abductors: 5 out of 5 Gait unremarkable.  Osteopathic findings  Cervical C2 flexed rotated inside that right  Thoracic T3 extended rotated and side bent left T6 extended rotated and side bent right  Lumbar L2 flexed rotated and side bent right  Sacrum Left on left   Impression and Recommendations:     This case required medical decision making of moderate complexity.

## 2014-05-19 ENCOUNTER — Encounter: Payer: Self-pay | Admitting: Family Medicine

## 2014-05-19 ENCOUNTER — Ambulatory Visit (INDEPENDENT_AMBULATORY_CARE_PROVIDER_SITE_OTHER): Payer: BLUE CROSS/BLUE SHIELD | Admitting: Family Medicine

## 2014-05-19 VITALS — BP 118/82 | HR 49 | Ht 73.0 in | Wt 198.0 lb

## 2014-05-19 DIAGNOSIS — M545 Low back pain, unspecified: Secondary | ICD-10-CM

## 2014-05-19 DIAGNOSIS — M9903 Segmental and somatic dysfunction of lumbar region: Secondary | ICD-10-CM

## 2014-05-19 DIAGNOSIS — M9904 Segmental and somatic dysfunction of sacral region: Secondary | ICD-10-CM

## 2014-05-19 DIAGNOSIS — M999 Biomechanical lesion, unspecified: Secondary | ICD-10-CM

## 2014-05-19 DIAGNOSIS — M9902 Segmental and somatic dysfunction of thoracic region: Secondary | ICD-10-CM

## 2014-05-19 NOTE — Assessment & Plan Note (Signed)
Decision today to treat with OMT was based on Physical Exam  After verbal consent patient was treated with HVLA, ME techniques in thoracic, lumbar and sacral areas  Patient tolerated the procedure well with improvement in symptoms  Patient given exercises, stretches and lifestyle modifications  See medications in patient instructions if given  Patient will follow up in 2weeks 

## 2014-05-19 NOTE — Progress Notes (Signed)
  Tawana ScaleZach Delailah Spieth D.O. [facility] [address] [place], [place] [address] Phone: [phone]([phone] Subjective:     CC: Back pain exacerbation  DGL:OVFIEPPIRJHPI:Subjective [name] is a 29 y.o. male coming in for followup of low back pain. Patient does CrossFit avidly. Patient states that on Christmas Eve patient was lifting and just some mild increase in extension on one of his lifts. Patient states following that 2 days he had some soreness. Patient and attempted to do some light lifting and even then gave him some pain. Patient states that he seems to be localized in the lower back. Dull throbbing sensation that occurs intermittently. Denies any radiation down the leg or any numbness or tingling. This does feel somewhat different than previous exam.   Patient also at last visit did have what appeared to be medial epicondylitis. No longer giving him any pain.   Past medical history, social, surgical and family history all reviewed in electronic medical record.   Review of Systems: No headache, visual changes, nausea, vomiting, diarrhea, constipation, dizziness, abdominal pain, skin rash, fevers, chills, night sweats, weight loss, swollen lymph nodes, body aches, joint swelling, muscle aches, chest pain, shortness of breath, mood changes.   Objective Blood pressure 118/82, pulse 49, height 6\' 1"  (1.854 m), weight 198 lb (89.812 kg), SpO2 98 %.  General: No apparent distress alert and oriented x3 mood and affect normal, dressed appropriately.  HEENT: Pupils equal, extraocular movements intact  Respiratory: Patient's speak in full sentences and does not appear short of breath  Cardiovascular: No lower extremity edema, non tender, no erythema  Skin: Warm dry intact with no signs of infection or rash on extremities or on axial skeleton.  Abdomen: Soft nontender  Neuro: Cranial nerves II through XII are intact, neurovascularly intact in all extremities with 2+ DTRs and 2+ pulses.  Lymph: No  lymphadenopathy of posterior or anterior cervical chain or axillae bilaterally.  Gait normal with good balance and coordination.  MSK:  Non tender with full range of motion and good stability and symmetric strength and tone of shoulders,  wrist, hip, knee and ankles bilaterally.  Back Exam:  Inspection: Unremarkable  Motion: Flexion 45 deg, Extension 35 deg, Side Bending to 35 deg bilaterally,  Rotation to 45 deg bilaterally  SLR laying: Negative  XSLR laying: Negative  Palpable tenderness: severe tenderness noted over L4-L5 on the right paraspinal musculature FABER: negative. Sensory change: Gross sensation intact to all lumbar and sacral dermatomes.  Reflexes: 2+ at both patellar tendons, 2+ at achilles tendons, Babinski's downgoing.  Strength at foot  Plantar-flexion: 5/5 Dorsi-flexion: 5/5 Eversion: 5/5 Inversion: 5/5  Leg strength  Quad: 5/5 Hamstring: 5/5 Hip flexor: 5/5 with mild improvement in range of motion Hip abductors: 5 out of 5 Gait unremarkable.  Osteopathic findings  Cervical C2 flexed rotated inside that right  Thoracic T3 extended rotated and side bent left T6 extended rotated and side bent right  Lumbar L2 flexed rotated and side bent left L4 flexed rotated and side bent right  Sacrum Left on left   Impression and Recommendations:     This case required medical decision making of moderate complexity.

## 2014-05-19 NOTE — Patient Instructions (Signed)
Good to see you Try the medicine 2 times daily for at least next day if not 2 times daily.  Good to start lifting again tomorrow. Start 50% then increase 10-15% a week.  Ice after the lifting.  See me again in 2 weeks.

## 2014-05-19 NOTE — Assessment & Plan Note (Signed)
I do believe the patient is having some exacerbation likely did have some rotation of the L4-L5 and S1 area. Think this is more of a backward torsion and likely caused significant amount of discomfort. Patient did respond well to osteopathic manipulation today. We discussed proper lifting technique as well as icing protocol. We discussed home exercises. We discussed avoiding any significant limitation and was given an exercise prescription today on how to increase the weight. Patient come back in 2 weeks. If continuing have difficulty I would like to get x-rays and we may need to consider further evaluation with advanced imaging.

## 2014-06-03 ENCOUNTER — Ambulatory Visit (INDEPENDENT_AMBULATORY_CARE_PROVIDER_SITE_OTHER): Payer: BLUE CROSS/BLUE SHIELD | Admitting: Family Medicine

## 2014-06-03 ENCOUNTER — Encounter: Payer: Self-pay | Admitting: Family Medicine

## 2014-06-03 VITALS — BP 124/78 | HR 45 | Ht 73.0 in | Wt 198.0 lb

## 2014-06-03 DIAGNOSIS — M9903 Segmental and somatic dysfunction of lumbar region: Secondary | ICD-10-CM

## 2014-06-03 DIAGNOSIS — M545 Low back pain, unspecified: Secondary | ICD-10-CM

## 2014-06-03 DIAGNOSIS — M999 Biomechanical lesion, unspecified: Secondary | ICD-10-CM

## 2014-06-03 DIAGNOSIS — M9902 Segmental and somatic dysfunction of thoracic region: Secondary | ICD-10-CM

## 2014-06-03 DIAGNOSIS — M9904 Segmental and somatic dysfunction of sacral region: Secondary | ICD-10-CM

## 2014-06-03 NOTE — Progress Notes (Signed)
  Shawn ScaleZach Mason D.O.  [facility] [address] [place], [place] [address] Phone: [phone](336) [phone] Subjective:     CC: Back pain exacerbation  Shawn Mason is a 29 y.o. male coming in for followup of low back pain. Patient does CrossFit avidly. Patient did have an exacerbation or new injury approximately 3 weeks ago. Patient was seen and had some manipulation and we did do some mother medications. Patient was to return to lifting slowly over the course of the last several weeks. Patient states overall he is doing very well. Patient notices significant decrease in pain approximately 2 days after the last adjustment. Patient has started increasing his activity again. Patient denies any radiation any new symptoms. Overall patient has started lifting again and is feeling relatively well.   Patient also at last visit did have what appeared to be medial epicondylitis. No longer giving him any pain.   Past medical history, social, surgical and family history all reviewed in electronic medical record.   Review of Systems: No headache, visual changes, nausea, vomiting, diarrhea, constipation, dizziness, abdominal pain, skin rash, fevers, chills, night sweats, weight loss, swollen lymph nodes, body aches, joint swelling, muscle aches, chest pain, shortness of breath, mood changes.   Objective Blood pressure 124/78, pulse 45, height 6\' 1"  (1.854 m), weight 198 lb (89.812 kg), SpO2 99 %.  General: No apparent distress alert and oriented x3 mood and affect normal, dressed appropriately.  HEENT: Pupils equal, extraocular movements intact  Respiratory: Patient's speak in full sentences and does not appear short of breath  Cardiovascular: No lower extremity edema, non tender, no erythema  Skin: Warm dry intact with no signs of infection or rash on extremities or on axial skeleton.  Abdomen: Soft nontender  Neuro: Cranial nerves II through XII are intact, neurovascularly intact in all  extremities with 2+ DTRs and 2+ pulses.  Lymph: No lymphadenopathy of posterior or anterior cervical chain or axillae bilaterally.  Gait normal with good balance and coordination.  MSK:  Non tender with full range of motion and good stability and symmetric strength and tone of shoulders,  wrist, hip, knee and ankles bilaterally.  Back Exam:  Inspection: Unremarkable  Motion: Flexion 45 deg, Extension 35 deg, Side Bending to 35 deg bilaterally,  Rotation to 45 deg bilaterally  SLR laying: Negative  XSLR laying: Negative  Palpable tenderness: severe tenderness noted over L4-L5 on the right paraspinal musculature FABER: negative. Sensory change: Gross sensation intact to all lumbar and sacral dermatomes.  Reflexes: 2+ at both patellar tendons, 2+ at achilles tendons, Babinski's downgoing.  Strength at foot  Plantar-flexion: 5/5 Dorsi-flexion: 5/5 Eversion: 5/5 Inversion: 5/5  Leg strength  Quad: 5/5 Hamstring: 5/5 Hip flexor: 5/5 with mild improvement in range of motion Hip abductors: 5 out of 5 Gait unremarkable.  Osteopathic findings  Cervical C2 flexed rotated inside that right  Thoracic T3 extended rotated and side bent left T6 extended rotated and side bent right  Lumbar L2 flexed rotated and side bent left L4 flexed rotated and side bent right  Sacrum Left on left   Impression and Recommendations:     This case required medical decision making of moderate complexity.

## 2014-06-03 NOTE — Assessment & Plan Note (Signed)
She is doing much better after the exacerbation that he had. Patient will slowly increasing amount of exercising with increasing his weight 10% a week over the course of the next several weeks. We discussed continuing the icing and the home exercises after lifting. We discussed continuing the natural vitamins upper mentation's. We discussed what activities to potentially avoid. Patient is doing much betteramount of 4 week intervals.

## 2014-06-03 NOTE — Assessment & Plan Note (Signed)
Decision today to treat with OMT was based on Physical Exam  After verbal consent patient was treated with HVLA, ME techniques in thoracic, lumbar and sacral areas  Patient tolerated the procedure well with improvement in symptoms  Patient given exercises, stretches and lifestyle modifications  See medications in patient instructions if given  Patient will follow up in 4 weeks    

## 2014-06-03 NOTE — Patient Instructions (Addendum)
Good to see you as always.  Ice is your friend when hurting.  Try to continue the exercises or stretching afterwards OK to continue to increase lifting slowly. 10% a week.  Lets space you out to 4 weeks.

## 2014-07-01 ENCOUNTER — Ambulatory Visit (INDEPENDENT_AMBULATORY_CARE_PROVIDER_SITE_OTHER): Payer: BLUE CROSS/BLUE SHIELD | Admitting: Family Medicine

## 2014-07-01 ENCOUNTER — Encounter: Payer: Self-pay | Admitting: Family Medicine

## 2014-07-01 VITALS — BP 118/78 | HR 54 | Ht 73.0 in | Wt 198.0 lb

## 2014-07-01 DIAGNOSIS — M9902 Segmental and somatic dysfunction of thoracic region: Secondary | ICD-10-CM

## 2014-07-01 DIAGNOSIS — M545 Low back pain, unspecified: Secondary | ICD-10-CM

## 2014-07-01 DIAGNOSIS — M9903 Segmental and somatic dysfunction of lumbar region: Secondary | ICD-10-CM

## 2014-07-01 DIAGNOSIS — M9904 Segmental and somatic dysfunction of sacral region: Secondary | ICD-10-CM

## 2014-07-01 DIAGNOSIS — M999 Biomechanical lesion, unspecified: Secondary | ICD-10-CM

## 2014-07-01 NOTE — Assessment & Plan Note (Signed)
Patient is doing remarkably well at this time. Encourage patient to stay active. We discussed continuing to watch the lifting mechanics. Patient will continue with the over-the-counter natural supplementations. Patient will come back and see me again in 6 weeks for further evaluation and treatment.

## 2014-07-01 NOTE — Assessment & Plan Note (Signed)
Decision today to treat with OMT was based on Physical Exam  After verbal consent patient was treated with HVLA, ME techniques in thoracic, lumbar and sacral areas  Patient tolerated the procedure well with improvement in symptoms  Patient given exercises, stretches and lifestyle modifications  See medications in patient instructions if given  Patient will follow up in 6 weeks      

## 2014-07-01 NOTE — Patient Instructions (Addendum)
Good to see you You are doing great overall.  Too easy!!!! Keep focusing on the hip flexors and piriformis See me again in 6 weeks.

## 2014-07-01 NOTE — Progress Notes (Signed)
  Shawn ScaleZach Haedyn Mason D.O. [facility] [address] [place], [place] [address] Phone: [phone]([phone] Subjective:     CC: Back pain exacerbation  OZH:YQMVHQIONGHPI:Subjective [name] is a 29 y.o. male coming in for followup of low back pain. Patient does CrossFit avidly. Patient is doing significantly better. Patient states in his back pain is completely under control as long as he continues to do the hip flexor stretches before and after activity. Patient denies any new pains. Overall patient states that this is the best he is felt a long time.   Patient also at last visit did have what appeared to be medial epicondylitis. No longer giving him any pain.   Past medical history, social, surgical and family history all reviewed in electronic medical record.   Review of Systems: No headache, visual changes, nausea, vomiting, diarrhea, constipation, dizziness, abdominal pain, skin rash, fevers, chills, night sweats, weight loss, swollen lymph nodes, body aches, joint swelling, muscle aches, chest pain, shortness of breath, mood changes.   Objective Blood pressure 118/78, pulse 54, height 6\' 1"  (1.854 m), weight 198 lb (89.812 kg), SpO2 99 %.  General: No apparent distress alert and oriented x3 mood and affect normal, dressed appropriately.  HEENT: Pupils equal, extraocular movements intact  Respiratory: Patient's speak in full sentences and does not appear short of breath  Cardiovascular: No lower extremity edema, non tender, no erythema  Skin: Warm dry intact with no signs of infection or rash on extremities or on axial skeleton.  Abdomen: Soft nontender  Neuro: Cranial nerves II through XII are intact, neurovascularly intact in all extremities with 2+ DTRs and 2+ pulses.  Lymph: No lymphadenopathy of posterior or anterior cervical chain or axillae bilaterally.  Gait normal with good balance and coordination.  MSK:  Non tender with full range of motion and good stability and symmetric strength  and tone of shoulders,  wrist, hip, knee and ankles bilaterally.  Back Exam:  Inspection: Unremarkable  Motion: Flexion 45 deg, Extension 35 deg, Side Bending to 35 deg bilaterally,  Rotation to 45 deg bilaterally  SLR laying: Negative  XSLR laying: Negative  Palpable tenderness: severe tenderness noted over L4-L5 on the right paraspinal musculature FABER: negative. Sensory change: Gross sensation intact to all lumbar and sacral dermatomes.  Reflexes: 2+ at both patellar tendons, 2+ at achilles tendons, Babinski's downgoing.  Strength at foot  Plantar-flexion: 5/5 Dorsi-flexion: 5/5 Eversion: 5/5 Inversion: 5/5  Leg strength  Quad: 5/5 Hamstring: 5/5 Hip flexor: 5/5 with mild improvement in range of motion Hip abductors: 5 out of 5 Gait unremarkable.  Osteopathic findings  Cervical C2 flexed rotated inside that right  Thoracic T3 extended rotated and side bent left T5 extended rotated and side bent right  Lumbar L2 flexed rotated and side bent left  Sacrum Left on left   Impression and Recommendations:     This case required medical decision making of moderate complexity.

## 2014-07-01 NOTE — Progress Notes (Signed)
Pre visit review using our clinic review tool, if applicable. No additional management support is needed unless otherwise documented below in the visit note. 

## 2014-08-12 ENCOUNTER — Ambulatory Visit: Payer: BLUE CROSS/BLUE SHIELD | Admitting: Family Medicine

## 2014-08-21 ENCOUNTER — Encounter: Payer: Self-pay | Admitting: Family Medicine

## 2014-08-21 ENCOUNTER — Ambulatory Visit (INDEPENDENT_AMBULATORY_CARE_PROVIDER_SITE_OTHER): Payer: BLUE CROSS/BLUE SHIELD | Admitting: Family Medicine

## 2014-08-21 VITALS — BP 122/76 | HR 60 | Ht 73.0 in | Wt 198.0 lb

## 2014-08-21 DIAGNOSIS — M9902 Segmental and somatic dysfunction of thoracic region: Secondary | ICD-10-CM

## 2014-08-21 DIAGNOSIS — M9904 Segmental and somatic dysfunction of sacral region: Secondary | ICD-10-CM

## 2014-08-21 DIAGNOSIS — M9903 Segmental and somatic dysfunction of lumbar region: Secondary | ICD-10-CM

## 2014-08-21 DIAGNOSIS — M545 Low back pain, unspecified: Secondary | ICD-10-CM

## 2014-08-21 DIAGNOSIS — M999 Biomechanical lesion, unspecified: Secondary | ICD-10-CM

## 2014-08-21 NOTE — Progress Notes (Signed)
  Shawn ScaleZach Whitnie Mason D.O. [facility] [address] [place], [place] [address] Phone: [phone]) [phone] Subjective:     CC: Back pain exacerbation  YQM:VHQIONGEXBHPI:Subjective [name] is a 29 y.o. male coming in for followup of low back pain. Patient does CrossFit avidly. Patient is doing significantly better. Patient states in his back pain is completely under control as long as he continues to do the hip flexor stretches before and after activity. Patient denies any new pains. he has been doing more increasing weight lifting. We discussed icing regimen and home exercises. He states as long as he does the stretching he does well. Patient has noticed some mild soreness in the knees but nothing significant.   Patient also at last visit did have what appeared to be medial epicondylitis. No longer giving him any pain.   Past medical history, social, surgical and family history all reviewed in electronic medical record.   Review of Systems: No headache, visual changes, nausea, vomiting, diarrhea, constipation, dizziness, abdominal pain, skin rash, fevers, chills, night sweats, weight loss, swollen lymph nodes, body aches, joint swelling, muscle aches, chest pain, shortness of breath, mood changes.   Objective Blood pressure 122/76, pulse 60, height 6\' 1"  (1.854 m), weight 198 lb (89.812 kg), SpO2 98 %.  General: No apparent distress alert and oriented x3 mood and affect normal, dressed appropriately.  HEENT: Pupils equal, extraocular movements intact  Respiratory: Patient's speak in full sentences and does not appear short of breath  Cardiovascular: No lower extremity edema, non tender, no erythema  Skin: Warm dry intact with no signs of infection or rash on extremities or on axial skeleton.  Abdomen: Soft nontender  Neuro: Cranial nerves II through XII are intact, neurovascularly intact in all extremities with 2+ DTRs and 2+ pulses.  Lymph: No lymphadenopathy of posterior or anterior cervical chain  or axillae bilaterally.  Gait normal with good balance and coordination.  MSK:  Non tender with full range of motion and good stability and symmetric strength and tone of shoulders,  wrist, hip, knee and ankles bilaterally.  Back Exam:  Inspection: Unremarkable  Motion: Flexion 45 deg, Extension 35 deg, Side Bending to 35 deg bilaterally,  Rotation to 45 deg bilaterally  SLR laying: Negative  XSLR laying: Negative  Palpable tenderness: Nontender on exam FABER: negative. Sensory change: Gross sensation intact to all lumbar and sacral dermatomes.  Reflexes: 2+ at both patellar tendons, 2+ at achilles tendons, Babinski's downgoing.  Strength at foot  Plantar-flexion: 5/5 Dorsi-flexion: 5/5 Eversion: 5/5 Inversion: 5/5  Leg strength  Quad: 5/5 Hamstring: 5/5 Hip flexor: 5/5 with mild improvement in range of motion Hip abductors: 5 out of 5 Gait unremarkable.  Osteopathic findings  Cervical C2 flexed rotated inside that right  Thoracic T3 extended rotated and side bent left T5 extended rotated and side bent right  Lumbar L2 flexed rotated and side bent left  Sacrum Left on left  Same pattern as previously   Impression and Recommendations:     This case required medical decision making of moderate complexity.

## 2014-08-21 NOTE — Assessment & Plan Note (Signed)
Decision today to treat with OMT was based on Physical Exam  After verbal consent patient was treated with HVLA, ME techniques in thoracic, lumbar and sacral areas  Patient tolerated the procedure well with improvement in symptoms  Patient given exercises, stretches and lifestyle modifications  See medications in patient instructions if given  Patient will follow up in 8-12 weeks                          

## 2014-08-21 NOTE — Patient Instructions (Addendum)
Too easy I go t nothing Consider compression tights or compression knees sleeves if lifting  Keep up with the cycles See me again 2 months

## 2014-08-21 NOTE — Assessment & Plan Note (Signed)
Patient overall is doing very well. Discussed different changes patient can make if he is going to start increasing his weight. We discussed icing regimen and home exercises still. We discussed proper lifting mechanics. Patient will come back and see me again in 8-12 weeks.

## 2014-08-21 NOTE — Progress Notes (Signed)
Pre visit review using our clinic review tool, if applicable. No additional management support is needed unless otherwise documented below in the visit note. 

## 2014-10-22 ENCOUNTER — Ambulatory Visit: Payer: BLUE CROSS/BLUE SHIELD | Admitting: Family Medicine

## 2014-10-23 ENCOUNTER — Ambulatory Visit (INDEPENDENT_AMBULATORY_CARE_PROVIDER_SITE_OTHER): Payer: BLUE CROSS/BLUE SHIELD | Admitting: Family Medicine

## 2014-10-23 ENCOUNTER — Encounter: Payer: Self-pay | Admitting: Family Medicine

## 2014-10-23 VITALS — BP 106/78 | HR 56 | Ht 73.0 in | Wt 200.0 lb

## 2014-10-23 DIAGNOSIS — M9902 Segmental and somatic dysfunction of thoracic region: Secondary | ICD-10-CM

## 2014-10-23 DIAGNOSIS — M999 Biomechanical lesion, unspecified: Secondary | ICD-10-CM

## 2014-10-23 DIAGNOSIS — M9903 Segmental and somatic dysfunction of lumbar region: Secondary | ICD-10-CM | POA: Diagnosis not present

## 2014-10-23 DIAGNOSIS — M545 Low back pain, unspecified: Secondary | ICD-10-CM

## 2014-10-23 DIAGNOSIS — M9904 Segmental and somatic dysfunction of sacral region: Secondary | ICD-10-CM

## 2014-10-23 NOTE — Assessment & Plan Note (Signed)
Patient doing remarkably well at this time. We discussed home exercises and icing protocol, we discussed patient's lifting mechanics especially with squatting and increasing the size of his stance. We discussed eccentric aspect of lifting could be more beneficial than truly increasing the weight. Patient will try to make these different changes and continue home exercises and will see me again in 8 weeks for further evaluation and treatment.

## 2014-10-23 NOTE — Assessment & Plan Note (Signed)
Decision today to treat with OMT was based on Physical Exam  After verbal consent patient was treated with HVLA, ME techniques in thoracic, lumbar and sacral areas  Patient tolerated the procedure well with improvement in symptoms  Patient given exercises, stretches and lifestyle modifications  See medications in patient instructions if given  Patient will follow up in 8-12 weeks                          

## 2014-10-23 NOTE — Progress Notes (Signed)
Pre visit review using our clinic review tool, if applicable. No additional management support is needed unless otherwise documented below in the visit note. 

## 2014-10-23 NOTE — Patient Instructions (Signed)
Good to see you Wider stance on squats then give knees a break.  Try to work on the negative Back is great Too easy See me in 2 months

## 2014-10-23 NOTE — Progress Notes (Signed)
  [name] [address] [place], [place] [address] Phone: [phone] Subjective:     CC: Back pain exacerbation  Shawn Mason [name] is a 29 y.o. male coming in for followup of low back pain. Patient does CrossFit avidly. Patient has been doing very well. Patient has been able to increase his lifting as well as this snatch personal record. Patient states that he had some mild knee pain bilaterally but this seemed to resolve after him decreasing his weight. States that his back overlies feels good. Patient continues to stay very active. Continues to do the stretching on a regular basis.  Patient also at last visit did have what appeared to be medial epicondylitis. No longer giving him any pain.   Past medical history, social, surgical and family history all reviewed in electronic medical record.   Review of Systems: No headache, visual changes, nausea, vomiting, diarrhea, constipation, dizziness, abdominal pain, skin rash, fevers, chills, night sweats, weight loss, swollen lymph nodes, body aches, joint swelling, muscle aches, chest pain, shortness of breath, mood changes.   Objective Blood pressure 106/78, pulse 56, height 6\' 1"  (1.854 m), weight 200 lb (90.719 kg), SpO2 99 %.  General: No apparent distress alert and oriented x3 mood and affect normal, dressed appropriately.  HEENT: Pupils equal, extraocular movements intact  Respiratory: Patient's speak in full sentences and does not appear short of breath  Cardiovascular: No lower extremity edema, non tender, no erythema  Skin: Warm dry intact with no signs of infection or rash on extremities or on axial skeleton.  Abdomen: Soft nontender  Neuro: Cranial nerves II through XII are intact, neurovascularly intact in all extremities with 2+ DTRs and 2+ pulses.  Lymph: No lymphadenopathy of posterior or anterior cervical chain or axillae bilaterally.  Gait normal with good balance and coordination.    MSK:  Non tender with full range of motion and good stability and symmetric strength and tone of shoulders,  wrist, hip, knee and ankles bilaterally.  Back Exam:  Inspection: Unremarkable  Motion: Flexion 45 deg, Extension 35 deg, Side Bending to 35 deg bilaterally,  Rotation to 45 deg bilaterally  SLR laying: Negative  XSLR laying: Negative  Palpable tenderness: Nontender on exam FABER: negative. Sensory change: Gross sensation intact to all lumbar and sacral dermatomes.  Reflexes: 2+ at both patellar tendons, 2+ at achilles tendons, Babinski's downgoing.  Strength at foot  Plantar-flexion: 5/5 Dorsi-flexion: 5/5 Eversion: 5/5 Inversion: 5/5  Leg strength  Quad: 5/5 Hamstring: 5/5 Hip flexor: 5/5 with mild improvement in range of motion Hip abductors: 5 out of 5 Gait unremarkable. No significant change from previous exam  Osteopathic findings  Cervical C2 flexed rotated inside that right  Thoracic T3 extended rotated and side bent left T5 extended rotated and side bent right T8 extended rotated and side bent left  Lumbar L2 flexed rotated and side bent left  Sacrum Left on left     Impression and Recommendations:     This case required medical decision making of moderate complexity.

## 2014-12-23 ENCOUNTER — Ambulatory Visit (INDEPENDENT_AMBULATORY_CARE_PROVIDER_SITE_OTHER): Payer: BLUE CROSS/BLUE SHIELD | Admitting: Family Medicine

## 2014-12-23 ENCOUNTER — Encounter: Payer: Self-pay | Admitting: Family Medicine

## 2014-12-23 VITALS — BP 122/80 | HR 63 | Ht 73.0 in | Wt 199.0 lb

## 2014-12-23 DIAGNOSIS — M9903 Segmental and somatic dysfunction of lumbar region: Secondary | ICD-10-CM | POA: Diagnosis not present

## 2014-12-23 DIAGNOSIS — M24559 Contracture, unspecified hip: Secondary | ICD-10-CM

## 2014-12-23 DIAGNOSIS — M9904 Segmental and somatic dysfunction of sacral region: Secondary | ICD-10-CM | POA: Diagnosis not present

## 2014-12-23 DIAGNOSIS — M9902 Segmental and somatic dysfunction of thoracic region: Secondary | ICD-10-CM

## 2014-12-23 DIAGNOSIS — M999 Biomechanical lesion, unspecified: Secondary | ICD-10-CM

## 2014-12-23 NOTE — Progress Notes (Signed)
Pre visit review using our clinic review tool, if applicable. No additional management support is needed unless otherwise documented below in the visit note. 

## 2014-12-23 NOTE — Assessment & Plan Note (Signed)
Decision today to treat with OMT was based on Physical Exam  After verbal consent patient was treated with HVLA, ME techniques in thoracic, lumbar and sacral areas  Patient tolerated the procedure well with improvement in symptoms  Patient given exercises, stretches and lifestyle modifications  See medications in patient instructions if given  Patient will follow up in 8-12 weeks                          
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
## 2014-12-23 NOTE — Assessment & Plan Note (Signed)
Continue the tightness discussed with patient about doing the exercises on a regular basis. Patient continues to work on his core strength and no heavy lifting. We discussed continuing to work on proper form. Lungs patient continues to do well he can see me in 6-8 weeks for his back.

## 2014-12-23 NOTE — Patient Instructions (Addendum)
Always good to see you Continue to stay active and be careful with lifting.  Watch the thumb and try sopme padding of some sort like gloves 'If thumb is worse call me would drop weight a little The neck may be tight for 2 days and if I were you take motrin or ibuprofen 600-800mg  2 times a day for a couple days.  Ice when you need it See me again in 3 weeks if thum is not better

## 2015-01-16 ENCOUNTER — Ambulatory Visit (INDEPENDENT_AMBULATORY_CARE_PROVIDER_SITE_OTHER): Payer: BLUE CROSS/BLUE SHIELD | Admitting: Family Medicine

## 2015-01-16 ENCOUNTER — Encounter: Payer: Self-pay | Admitting: Family Medicine

## 2015-01-16 VITALS — BP 116/80 | HR 44 | Wt 199.0 lb

## 2015-01-16 DIAGNOSIS — M9903 Segmental and somatic dysfunction of lumbar region: Secondary | ICD-10-CM | POA: Diagnosis not present

## 2015-01-16 DIAGNOSIS — M25532 Pain in left wrist: Secondary | ICD-10-CM

## 2015-01-16 DIAGNOSIS — M9902 Segmental and somatic dysfunction of thoracic region: Secondary | ICD-10-CM | POA: Diagnosis not present

## 2015-01-16 DIAGNOSIS — M9904 Segmental and somatic dysfunction of sacral region: Secondary | ICD-10-CM | POA: Diagnosis not present

## 2015-01-16 DIAGNOSIS — M545 Low back pain, unspecified: Secondary | ICD-10-CM

## 2015-01-16 DIAGNOSIS — M999 Biomechanical lesion, unspecified: Secondary | ICD-10-CM

## 2015-01-16 MED ORDER — NITROGLYCERIN 0.2 MG/HR TD PT24: MEDICATED_PATCH | TRANSDERMAL | Status: DC

## 2015-01-16 NOTE — Assessment & Plan Note (Signed)
Doing significantly better at this time. We discussed icing regimen, home exercises, continuing to work on core strengthening and hip abductor strengthening. Patient will avoid any significant heavy loading her now more secondary to his wrist. Patient did respond well to osteopathic manipulation. We'll see him back again in 3-4 weeks for further evaluation and treatment.

## 2015-01-16 NOTE — Patient Instructions (Signed)
Good to see you Ice after lifting Wear brace at least at night for next 2 weeks.  Nitro again.  Nitroglycerin Protocol   Apply 1/4 nitroglycerin patch to affected area daily.  Change position of patch within the affected area every 24 hours.  You may experience a headache during the first 1-2 weeks of using the patch, these should subside.  If you experience headaches after beginning nitroglycerin patch treatment, you may take your preferred over the counter pain reliever.  Another side effect of the nitroglycerin patch is skin irritation or rash related to patch adhesive.  Please notify our office if you develop more severe headaches or rash, and stop the patch.  Tendon healing with nitroglycerin patch may require 12 to 24 weeks depending on the extent of injury.  Men should not use if taking Viagra, Cialis, or Levitra.   Do not use if you have migraines or rosacea.  See me again in 3 weeks.

## 2015-01-16 NOTE — Assessment & Plan Note (Signed)
Decision today to treat with OMT was based on Physical Exam  After verbal consent patient was treated with HVLA, ME techniques in thoracic, lumbar and sacral areas  Patient tolerated the procedure well with improvement in symptoms  Patient given exercises, stretches and lifestyle modifications  See medications in patient instructions if given  Patient will follow up in 3-4 weeks due to wrist injury and then we will space them out back to his regular 2-3 month intervals.
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
## 2015-01-16 NOTE — Assessment & Plan Note (Signed)
Patient's likely has more of a tendon injury. Patient given topical patches and we'll see if this will be beneficial. We will discuss formal ultrasound at next follow-up. We discussed bracing at night, as well as range of motion exercises. Patient will avoid any heavy lifting. Differential also includes cervical hamate fracture and very low likelihood scaphoid injury but does not appear patient is in enough pain for one of those. Patient will try these different interventions come back again in 3 weeks for further evaluation and treatment.

## 2015-02-09 ENCOUNTER — Ambulatory Visit (INDEPENDENT_AMBULATORY_CARE_PROVIDER_SITE_OTHER): Payer: BLUE CROSS/BLUE SHIELD | Admitting: Family Medicine

## 2015-02-09 ENCOUNTER — Encounter: Payer: Self-pay | Admitting: Family Medicine

## 2015-02-09 VITALS — BP 104/80 | HR 58 | Ht 73.0 in | Wt 198.0 lb

## 2015-02-09 DIAGNOSIS — M9904 Segmental and somatic dysfunction of sacral region: Secondary | ICD-10-CM

## 2015-02-09 DIAGNOSIS — M25532 Pain in left wrist: Secondary | ICD-10-CM

## 2015-02-09 DIAGNOSIS — M9902 Segmental and somatic dysfunction of thoracic region: Secondary | ICD-10-CM | POA: Diagnosis not present

## 2015-02-09 DIAGNOSIS — M999 Biomechanical lesion, unspecified: Secondary | ICD-10-CM

## 2015-02-09 DIAGNOSIS — M545 Low back pain, unspecified: Secondary | ICD-10-CM

## 2015-02-09 DIAGNOSIS — M9903 Segmental and somatic dysfunction of lumbar region: Secondary | ICD-10-CM | POA: Diagnosis not present

## 2015-02-09 NOTE — Progress Notes (Signed)
[name] [address] [place], [place] [address] Phone: [phone] Subjective:     CC: Back pain follow up, wrist pain follow-up  Shawn Mason is a 29 y.o. male coming in for followup of low back pain. Patient does CrossFit avidly. Has been doing very well and continues to remain active. Patient has been doing his regular lifting and has been feeling relatively well. Patient has not notice any significant difficulties.   Patient did have some left wrist pain. Patient had more of a tendon injury. Patient did wear the immobilizing brace for one week day and night. Patient then tented to increase his activity again and continued to have the same soreness. Maybe a little improved. Patient is doing the nitroglycerin glycerin without any side effects. Denies any new symptoms still located around the left thumb     Past medical history, social, surgical and family history all reviewed in electronic medical record.   Review of Systems: No headache, visual changes, nausea, vomiting, diarrhea, constipation, dizziness, abdominal pain, skin rash, fevers, chills, night sweats, weight loss, swollen lymph nodes, body aches, joint swelling, muscle aches, chest pain, shortness of breath, mood changes.   Objective Blood pressure 104/80, pulse 58, height  (1.854 m), weight 198 lb (89.812 kg), SpO2 98 %.  General: No apparent distress alert and oriented x3 mood and affect normal, dressed appropriately.  HEENT: Pupils equal, extraocular movements intact  Respiratory: Patient's speak in full sentences and does not appear short of breath  Cardiovascular: No lower extremity edema, non tender, no erythema  Skin: Warm dry intact with no signs of infection or rash on extremities or on axial skeleton.  Abdomen: Soft nontender  Neuro: Cranial nerves II through XII are intact, neurovascularly intact in all extremities with 2+ DTRs and 2+ pulses.  Lymph: No  lymphadenopathy of posterior or anterior cervical chain or axillae bilaterally.  Gait normal with good balance and coordination.  MSK:  Non tender with full range of motion and good stability and symmetric strength and tone of shoulders,   hip, knee and ankles bilaterally.  Wrist: Left Inspection normal with no visible erythema or swelling. ROM smooth and normal with good flexion and extension and ulnar/radial deviation that is symmetrical with opposite wrist. Minimal discomfort with fibular deviation and extension of the wrist as well as some deep palpation around just proximal to the [facility] joint. No significant pain though over the scaphoid bone itself. Palpation is normal over metacarpals, navicular, lunate, and TFCC; tendons without tenderness/ swelling Very minimal tenderness over the snuff box more pain on the dorsal aspect No tenderness over Canal of Guyon. Strength 5/5 in all directions without pain. Negative Finkelstein, tinel's and phalens. Negative Watson's test.  Back Exam:  Inspection: Unremarkable  Motion: Flexion 45 deg, Extension 35 deg, Side Bending to 35 deg bilaterally,  Rotation to 45 deg bilaterally  SLR laying: Negative  XSLR laying: Negative  Palpable tenderness: Nontender on exam FABER: negative. Sensory change: Gross sensation intact to all lumbar and sacral dermatomes.  Reflexes: 2+ at both patellar tendons, 2+ at achilles tendons, Babinski's downgoing.  Strength at foot  Plantar-flexion: 5/5 Dorsi-flexion: 5/5 Eversion: 5/5 Inversion: 5/5  Leg strength  Quad: 5/5 Hamstring: 5/5 Hip flexor: 5/5 with mild improvement in range of motion Hip abductors: 5 out of 5 Gait unremarkable. Continues to do well  Osteopathic findings  Cervical C2 flexed rotated inside that right  Thoracic T3 extended rotated and side bent  left T5 extended rotated and side bent right T8 extended rotated and side bent left  Lumbar L2 flexed rotated and side bent left  Sacrum Left  on left Same pattern as previously    Impression and Recommendations:     This case required medical decision making of moderate complexity.

## 2015-02-09 NOTE — Progress Notes (Signed)
Pre visit review using our clinic review tool, if applicable. No additional management support is needed unless otherwise documented below in the visit note. 

## 2015-02-09 NOTE — Assessment & Plan Note (Signed)
Patient continues to do CrossFit. Patient does have good course strength. We discussed though with patient about avoiding significant extreme range of motion including deep squats. Patient will try to avoid this for the next several weeks. We discussed icing regimen. Patient will come back and see me again in 3-4 weeks.

## 2015-02-09 NOTE — Assessment & Plan Note (Signed)
Decision today to treat with OMT was based on Physical Exam  After verbal consent patient was treated with HVLA, ME techniques in thoracic, lumbar and sacral areas  Patient tolerated the procedure well with improvement in symptoms  Patient given exercises, stretches and lifestyle modifications  See medications in patient instructions if given  Patient will follow up in 3-4 weeks  

## 2015-02-09 NOTE — Patient Instructions (Signed)
Good to see you Lets push it a little bit Vitamin D 4000 IU daily Ice after activity Lets get xray in 1 week See me again in 3 weeks.

## 2015-02-09 NOTE — Assessment & Plan Note (Signed)
Still likely more of a tendinitis. We discussed the possibility of getting x-rays of the wrist at a later date if he does not seem to improve. Patient will attempt to try to increase his activity and if it seems to worsen we will discuss immobilization for 3-4 weeks which patient like to avoid. Patient will try to increase the nitroglycerin patches to help aid in healing. Return in 3-4 weeks for further evaluation.

## 2015-02-16 ENCOUNTER — Telehealth: Payer: Self-pay | Admitting: Family Medicine

## 2015-02-16 NOTE — Telephone Encounter (Signed)
Patient did something to his foot yesterday and can not walk.  I have scheduled him for Wed but he would like to know if he can be worked in for tomorrow.

## 2015-02-16 NOTE — Telephone Encounter (Signed)
Spoke to pt, double booked him tomorrow .

## 2015-02-17 ENCOUNTER — Encounter: Payer: Self-pay | Admitting: Family Medicine

## 2015-02-17 ENCOUNTER — Ambulatory Visit (INDEPENDENT_AMBULATORY_CARE_PROVIDER_SITE_OTHER): Payer: BLUE CROSS/BLUE SHIELD | Admitting: Family Medicine

## 2015-02-17 ENCOUNTER — Other Ambulatory Visit (INDEPENDENT_AMBULATORY_CARE_PROVIDER_SITE_OTHER): Payer: BLUE CROSS/BLUE SHIELD

## 2015-02-17 VITALS — BP 120/80 | HR 49 | Ht 73.0 in | Wt 198.0 lb

## 2015-02-17 DIAGNOSIS — M79672 Pain in left foot: Secondary | ICD-10-CM | POA: Diagnosis not present

## 2015-02-17 DIAGNOSIS — S93692A Other sprain of left foot, initial encounter: Secondary | ICD-10-CM | POA: Diagnosis not present

## 2015-02-17 DIAGNOSIS — S96812A Strain of other specified muscles and tendons at ankle and foot level, left foot, initial encounter: Secondary | ICD-10-CM

## 2015-02-17 DIAGNOSIS — S96819A Strain of other specified muscles and tendons at ankle and foot level, unspecified foot, initial encounter: Secondary | ICD-10-CM | POA: Insufficient documentation

## 2015-02-17 NOTE — Progress Notes (Signed)
Pre visit review using our clinic review tool, if applicable. No additional management support is needed unless otherwise documented below in the visit note. 

## 2015-02-17 NOTE — Patient Instructions (Signed)
Good to see you ICe bath at night for 10 minutes Wear boot daily for next 10 days at least Avoid being barefoot.  Try the nitro in the area Will take 3-6 weeks   Medial Plantar Nerve Entrapment With Rehab Medial plantar, also called jogger's foot, affects the nervous system and causes pain and sometimes a loss of feeling in the foot, especially in the first (big) and second toes. An increase in pressure is placed on the medial plantar nerve by ligament-like tissue (fascia), which causes the symptoms. SYMPTOMS   Tingling, numbness, or burning from the arch of the foot to the first (big) and often second toe.  Pain and tenderness along the arch of the foot.  Pain that gets worse when standing on the tiptoes of the affected foot or after running.  A feeling of the ankle giving way.  Symptoms that get worse, or that are only present, during and after running on level ground. CAUSES  Increased pressure placed on the medial plantar nerve by structures that surround it causes medial plantar nerve entrapment. Pinching of the nerve by connective tissue (fascia) and muscle or bone most commonly causes the increase in pressure. However, increased pressure may also be caused by inflammation of the connective tissues on the sole of the foot (plantar fascia). RISK INCREASES WITH:  Recurring ankle sprains.  Sports that involve a lot of running (distance running).  External pressure being placed on the nerve (tight-fitting shoes, shoes with minimal padding, shoes with loss of shock absorbers, or use of new arch supports).  Looseness of the joints of the foot, flat feet, or stiffness of the big toe (hallux rigidus).  New arch supports that have high arches.  Medical disorders, including diabetes mellitus and thyroid disorders. PREVENTION   Warm up and stretch properly before activity.  Maintain physical fitness:  Strength, flexibility, and endurance.  Cardiovascular fitness.  Wear  properly fitted and padded equipment (shoes and orthotics).  Taping, protective strapping, bracing, or high-top tennis shoes may help prevent ankle sprains and nerve stretching injury. PROGNOSIS  If treated properly, medial plantar nerve entrapment usually goes away with non-surgical treatment. Medial plantar nerve entrapment may also go away on its own (spontaneously). Sometimes, surgery is needed. RELATED COMPLICATIONS   Permanent numbness in the foot and toes.  Persistent pain in the foot or ankle.  Inability to compete in sports, due to pain. TREATMENT  Treatment first involves resting from any activity that makes your symptoms worse. The use of ice and medicine will help reduce pain and inflammation. Orthotics (arch supports and heel raises) are helpful for reducing symptoms. It is important to incorporate cross-training activities into your training schedule. The use of strengthening and stretching exercises may help reduce pain with activity, especially exercises focused on the Achilles tendon. These exercises may be performed at home or with a therapist. If symptoms do not go away after at least one year of non-surgical treatment, surgery may be needed to free the entrapped nerve. Recovery after surgery usually lasts around 6 weeks.  MEDICATION   If pain medicine is needed, nonsteroidal anti-inflammatory medicines (aspirin and ibuprofen), or other minor pain relievers (acetaminophen), are often advised.  Do not take pain medicine for 7 days before surgery.  Prescription pain relievers may be given, if your caregiver thinks they are needed. Use only as directed and only as much as you need. HEAT AND COLD  Cold treatment (icing) should be applied for 10 to 15 minutes every 2 to  3 hours for inflammation and pain, and immediately after activity that aggravates your symptoms. Use ice packs or an ice massage.  Heat treatment may be used before performing stretching and strengthening  activities prescribed by your caregiver, physical therapist, or athletic trainer. Use a heat pack or a warm water soak. SEEK MEDICAL CARE IF:   Symptoms get worse or do not improve in 2 weeks, despite treatment.  New, unexplained symptoms develop. (Drugs used in treatment may produce side effects.) EXERCISES  RANGE OF MOTION (ROM) AND STRETCHING EXERCISES - Medial Plantar Nerve Entrapment (Jogger's Foot) These exercises may help you when beginning to rehabilitate your injury. Your symptoms may resolve with or without further involvement from your physician, physical therapist or athletic trainer. While completing these exercises, remember:   Restoring tissue flexibility helps normal motion to return to the joints. This allows healthier, less painful movement and activity.  An effective stretch should be held for at least 30 seconds.  A stretch should never be painful. You should only feel a gentle lengthening or release in the stretched tissue. RANGE OF MOTION - Toe Extension, Flexion  Sit with your right / left leg crossed over your opposite knee.  Grasp your toes and gently pull them back toward the top of your foot. You should feel a stretch on the bottom of your toes and foot.  Hold this stretch for __________ seconds.  Now, gently pull your toes toward the bottom of your foot. You should feel a stretch on the top of your toes and foot.  Hold this stretch for __________ seconds. Repeat __________ times. Complete this stretch __________ times per day.  RANGE OF MOTION - Ankle Eversion   Sit with your right / left ankle crossed over your opposite knee.  Grip your foot with your opposite hand, placing your thumb on the top of your foot and your fingers across the bottom of your foot.  Gently push your foot downward with a slight rotation so your littlest toes rise slightly toward the ceiling.  You should feel a gentle stretch on the inside of your ankle. Hold the stretch for  __________ seconds. Repeat __________ times. Complete this exercise __________ times per day.  STRETCH - Gastroc, Standing  Place your hands on a wall.  Extend your right / left leg behind you, keeping the front knee somewhat bent.  Slightly point your toes inward on your back foot.  Keeping your right / left heel on the floor and your knee straight, shift your weight toward the wall, not allowing your back to arch.  You should feel a gentle stretch in the __________ calf. Hold this position for __________ seconds. Repeat __________ times. Complete this stretch __________ times per day. STRETCH - Soleus, Standing   Place your hands on a wall.  Extend your right / left leg behind you, keeping the other knee somewhat bent.  Slightly point your toes inward on your back foot.  Keep your right / left heel on the floor, bend your back knee, and slightly shift your weight over the back leg so that you feel a gentle stretch deep in your back calf.  Hold this position for __________ seconds. Repeat __________ times. Complete this stretch __________ times per day. STRETCH - Gastrocsoleus, Standing Note: This exercise can place a lot of stress on your foot and ankle. Please complete this exercise only if specifically instructed by your caregiver.   Place the ball of your right / left foot on a step,  keeping your other foot firmly on the same step.  Hold on to the wall or a rail for balance.  Slowly lift your other foot, allowing your body weight to press your heel down over the edge of the step.  You should feel a stretch in your right / left calf.  Hold this position for __________ seconds.  Repeat this exercise with a slight bend in your right / left knee. Repeat __________ times. Complete this stretch __________ times per day.  STRETCH - Hamstrings, Supine   Lie on your back. Loop a belt or towel over the ball of your right / left foot.  Straighten your right / left knee and  slowly pull on the belt to raise your leg. Do not allow the right / left knee to bend. Keep your opposite leg flat on the floor.  Raise the leg until you feel a gentle stretch behind your right / left knee or thigh. Hold this position for __________ seconds. Repeat __________ times. Complete this stretch __________ times per day.  STRETCH - Hamstrings, Standing  Stand or sit and extend your right / left leg, placing your foot on a chair or foot stool.  Keep a slight arch in your low back and your hips straight forward.  Lead with your chest and lean forward at the waist until you feel a gentle stretch in the back of your right / left knee or thigh. (When done correctly, this exercise requires leaning only a small distance.)  Hold this position for __________ seconds. Repeat __________ times. Complete this stretch __________ times per day. STRENGTHENING EXERCISES - Medial Plantar Nerve Entrapment (Jogger's Foot) These exercises may help you when beginning to rehabilitate your injury. They may resolve your symptoms with or without further involvement from your physician, physical therapist or athletic trainer. While completing these exercises, remember:   Muscles can gain both the endurance and the strength needed for everyday activities through controlled exercises.  Complete these exercises as instructed by your physician, physical therapist or athletic trainer. Progress the resistance and repetitions only as guided. STRENGTH - Towel Curls  Sit in a chair, on a non-carpeted surface.  Place your foot on a towel, keeping your heel on the floor.  Pull the towel toward your heel only by curling your toes. Keep your heel on the floor.  If instructed by your physician, physical therapist or athletic trainer, weight may be added at the end of the towel. Repeat __________ times. Complete this exercise __________ times per day. STRENGTH - Ankle Eversion  Secure one end of a rubber exercise  band or tubing to a fixed object (table, pole). Loop the other end around your foot, just before your toes.  Place your fists between your knees. This will focus your strengthening at your ankle.  Drawing the band across your opposite foot, away from the pole, slowly, pull your little toe out and up. Make sure the band is positioned to resist the entire motion.  Hold this position for __________ seconds.  Have your muscles resist the band, as it slowly pulls your foot back to the starting position. Repeat __________ times. Complete this exercise __________ times per day.  STRENGTH - Ankle Inversion  Secure one end of a rubber exercise band or tubing to a fixed object (table, pole). Loop the other end around your foot, just before your toes.  Place your fists between your knees. This will focus your strengthening at your ankle.  Slowly, pull your big toe  up and in, making sure the band is positioned to resist the entire motion.  Hold this position for __________ seconds.  Have your muscles resist the band, as it slowly pulls your foot back to the starting position. Repeat __________ times. Complete this exercises __________ times per day.    This information is not intended to replace advice given to you by your health care provider. Make sure you discuss any questions you have with your health care provider.   Document Released: 04/25/2005 Document Revised: 09/09/2014 Document Reviewed: 08/07/2008 Elsevier Interactive Patient Education [employer]

## 2015-02-17 NOTE — Progress Notes (Signed)
  [name] [address] [place], [place] [address] Phone: [phone] Subjective:     CC: left ankle and ankle pain.   Shawn Mason is a 29 y.o. male coming in with complaint of ankle and foot pain. Patient states that he got up from standing yesterday and had pain on the medial calcaneal region on the left foot. Patient had severe pain that he was unable to walk until this morning. Patient denies any bruising or swelling. States that he was having some mild discomfort initially but nothing that stopping him from activity. Patient continued to work out on a regular basis. Patient states that it can even do mental aching pain when he is not standing on it but severe when he is standing on it.     Past medical history, social, surgical and family history all reviewed in electronic medical record.   Review of Systems: No headache, visual changes, nausea, vomiting, diarrhea, constipation, dizziness, abdominal pain, skin rash, fevers, chills, night sweats, weight loss, swollen lymph nodes, body aches, joint swelling, muscle aches, chest pain, shortness of breath, mood changes.   Objective Blood pressure 120/80, pulse 49, height  (1.854 m), weight 198 lb (89.812 kg), SpO2 97 %.  General: No apparent distress alert and oriented x3 mood and affect normal, dressed appropriately.  HEENT: Pupils equal, extraocular movements intact  Respiratory: Patient's speak in full sentences and does not appear short of breath  Cardiovascular: No lower extremity edema, non tender, no erythema  Skin: Warm dry intact with no signs of infection or rash on extremities or on axial skeleton.  Abdomen: Soft nontender  Neuro: Cranial nerves II through XII are intact, neurovascularly intact in all extremities with 2+ DTRs and 2+ pulses.  Lymph: No lymphadenopathy of posterior or anterior cervical chain or axillae bilaterally.  Gait normal with good balance and  coordination.  MSK:  Non tender with full range of motion and good stability and symmetric strength and tone of shoulders, elbows, wrist, hip, knees bilaterally.  Ankle: Left No visible erythema or swelling. Range of motion is full in all directions. Strength is 5/5 in all directions. Stable lateral and medial ligaments; squeeze test and kleiger test unremarkable; Talar dome nontender; No pain at base of 5th MT; No tenderness over cuboid; No tenderness over N spot or navicular prominence No tenderness on posterior aspects of lateral and medial malleolus No sign of peroneal tendon subluxations or tenderness to palpation Negative tarsal tunnel tinel's Able to walk 4 steps. Foot exam shows the patient does have some mild flattening of the longitudinal arch compared to the contralateral side. Severely tender to palpation over the medial calcaneal region. No bruising noted. Neurovascularly intact distally. Full range of motion of the hallux. Contralateral foot unremarkable.  Limited musculoskeletal ultrasound was performed and interpreted by Antoine Primas, M  Limited [place] skeletal ultrasound shows the patient does have a tear on the proximal aspect of the plantaris muscle. Hypoechoic changes and mild increase in Doppler flow noted. Impression: Plantaris muscle rupture.    Impression and Recommendations:     This case required medical decision making of moderate complexity.

## 2015-02-17 NOTE — Assessment & Plan Note (Signed)
New problem for patient. We discussed postop boot. Patient will try and nitroglycerin. We discussed icing regimen. We discussed avoiding any type of explosive movements for quite some time. Patient will come back in 10 days for further evaluation and treatment.

## 2015-02-18 ENCOUNTER — Ambulatory Visit: Payer: BLUE CROSS/BLUE SHIELD | Admitting: Family Medicine

## 2015-02-19 ENCOUNTER — Telehealth: Payer: Self-pay

## 2015-02-19 NOTE — Telephone Encounter (Signed)
Left message that would leave boot at front desk for his wife to pick up and she can drop off the other anytime in the next week.

## 2015-02-27 ENCOUNTER — Other Ambulatory Visit: Payer: BLUE CROSS/BLUE SHIELD

## 2015-02-27 ENCOUNTER — Encounter: Payer: Self-pay | Admitting: Family Medicine

## 2015-02-27 ENCOUNTER — Ambulatory Visit (INDEPENDENT_AMBULATORY_CARE_PROVIDER_SITE_OTHER): Payer: BLUE CROSS/BLUE SHIELD | Admitting: Family Medicine

## 2015-02-27 VITALS — BP 120/82 | HR 50 | Ht 73.0 in | Wt 198.0 lb

## 2015-02-27 DIAGNOSIS — S93692A Other sprain of left foot, initial encounter: Secondary | ICD-10-CM

## 2015-02-27 DIAGNOSIS — S96812A Strain of other specified muscles and tendons at ankle and foot level, left foot, initial encounter: Secondary | ICD-10-CM

## 2015-02-27 NOTE — Patient Instructions (Signed)
Good to see you Lets get you to a regular shoe if you want.  Ice still will be needed I would do the nitro patch in the area.  Get back in the gym avoiding front squats and heavy deadlift for next 2 weeks.  Try jogging but not running jumping for 2 weeks.  See me again in 3 weeks.

## 2015-02-27 NOTE — Assessment & Plan Note (Signed)
Patient is healed significantly better at this time. Discussed with patient to continue with the conservative therapy but patient demonstrated increasing his activity. Exercise per prescription given. Patient is well he will be able to continue to increase his activity. Follow up again in 4 weeks.

## 2015-02-27 NOTE — Progress Notes (Signed)
  Shawn ScaleZach Mason D.O. [facility] [address] [place], [place] [address] Phone: [phone](336) [phone] Subjective:     CC: left ankle and ankle pain.   YYT:KPTWSFKCLEHPI:Subjective [name] is a 29 y.o. male coming in with complaint of ankle and foot pain. Patient was seen previously and did have a plantaris rupture. Seem to be partial. Patient was put in a Cam Walker. States that after approximately 72 hours in the boot he felt significantly better. Has been slowly increasing the wear of his regular shoe. Patient denies any new symptoms. Has been avoiding any significant lifting at the time. Overall though would state that regular daily activities no longer hurt.     Past medical history, social, surgical and family history all reviewed in electronic medical record.   Review of Systems: No headache, visual changes, nausea, vomiting, diarrhea, constipation, dizziness, abdominal pain, skin rash, fevers, chills, night sweats, weight loss, swollen lymph nodes, body aches, joint swelling, muscle aches, chest pain, shortness of breath, mood changes.   Objective Blood pressure 120/82, pulse 50, height 6\' 1"  (1.854 Mason), weight 198 lb (89.812 kg), SpO2 98 %.  General: No apparent distress alert and oriented x3 mood and affect normal, dressed appropriately.  HEENT: Pupils equal, extraocular movements intact  Respiratory: Patient's speak in full sentences and does not appear short of breath  Cardiovascular: No lower extremity edema, non tender, no erythema  Skin: Warm dry intact with no signs of infection or rash on extremities or on axial skeleton.  Abdomen: Soft nontender  Neuro: Cranial nerves II through XII are intact, neurovascularly intact in all extremities with 2+ DTRs and 2+ pulses.  Lymph: No lymphadenopathy of posterior or anterior cervical chain or axillae bilaterally.  Gait normal with good balance and coordination.  MSK:  Non tender with full range of motion and good stability and symmetric  strength and tone of shoulders, elbows, wrist, hip, knees bilaterally.  Ankle: Left No visible erythema or swelling. Range of motion is full in all directions. Strength is 5/5 in all directions. Stable lateral and medial ligaments; squeeze test and kleiger test unremarkable; Talar dome nontender; No pain at base of 5th MT; No tenderness over cuboid; No tenderness over N spot or navicular prominence No tenderness on posterior aspects of lateral and medial malleolus No sign of peroneal tendon subluxations or tenderness to palpation Negative tarsal tunnel tinel's Able to walk 4 steps. Foot exam shows the patient does have some mild flattening of the longitudinal arch compared to the contralateral side. Mild tenderness still over the medial calcaneal region. Contralateral foot unremarkable.  Limited musculoskeletal ultrasound was performed and interpreted by Shawn PrimasSMITH, ZACHARY, Mason  Limited muscular skeletal ultrasound shows the patient does have a tear on the proximal aspect of the plantaris muscle but significant scar tissue formation. Seems to be healing very well. Hypoechoic changes and mild increase in Doppler flow noted. Impression: Plantaris muscle was greater than 80% healing    Impression and Recommendations:     This case required medical decision making of moderate complexity.

## 2015-02-27 NOTE — Progress Notes (Signed)
Pre visit review using our clinic review tool, if applicable. No additional management support is needed unless otherwise documented below in the visit note. 

## 2015-03-20 ENCOUNTER — Ambulatory Visit (INDEPENDENT_AMBULATORY_CARE_PROVIDER_SITE_OTHER): Payer: BLUE CROSS/BLUE SHIELD | Admitting: Family Medicine

## 2015-03-20 ENCOUNTER — Encounter: Payer: Self-pay | Admitting: Family Medicine

## 2015-03-20 VITALS — BP 122/80 | HR 56 | Ht 73.0 in | Wt 198.0 lb

## 2015-03-20 DIAGNOSIS — M9904 Segmental and somatic dysfunction of sacral region: Secondary | ICD-10-CM | POA: Diagnosis not present

## 2015-03-20 DIAGNOSIS — M9903 Segmental and somatic dysfunction of lumbar region: Secondary | ICD-10-CM | POA: Diagnosis not present

## 2015-03-20 DIAGNOSIS — S96812D Strain of other specified muscles and tendons at ankle and foot level, left foot, subsequent encounter: Secondary | ICD-10-CM

## 2015-03-20 DIAGNOSIS — M999 Biomechanical lesion, unspecified: Secondary | ICD-10-CM

## 2015-03-20 DIAGNOSIS — M545 Low back pain, unspecified: Secondary | ICD-10-CM

## 2015-03-20 DIAGNOSIS — M9902 Segmental and somatic dysfunction of thoracic region: Secondary | ICD-10-CM

## 2015-03-20 DIAGNOSIS — S93692D Other sprain of left foot, subsequent encounter: Secondary | ICD-10-CM | POA: Diagnosis not present

## 2015-03-20 NOTE — Patient Instructions (Signed)
Good to see you Keep watching the wrist and tell me.  Ice the wrist  Continue the nitro but move it to the areas  We discussed See me again after the whole family get together Happy holidays!

## 2015-03-20 NOTE — Assessment & Plan Note (Signed)
Decision today to treat with OMT was based on Physical Exam  After verbal consent patient was treated with HVLA, ME techniques in thoracic, lumbar and sacral areas  Patient tolerated the procedure well with improvement in symptoms  Patient given exercises, stretches and lifestyle modifications  See medications in patient instructions if given  Patient will follow up in 3-4 weeks  

## 2015-03-20 NOTE — Assessment & Plan Note (Signed)
Shawn Mason more the tightness. He is doing very well overall. We discussed icing and home exercises. Patient will remain active. See patient again in 4 weeks for further evaluation.

## 2015-03-20 NOTE — Progress Notes (Signed)
  Shawn ScaleZach Macyn Mason D.O. [facility] [address] [place], [place] [address] Phone: [phone](336) [phone] Subjective:     CC: left ankle and ankle pain.   ZHY:QMVHQIONGEHPI:Subjective [name] is a 29 y.o. male coming in with complaint of ankle and foot pain. Patient was seen previously and did have a plantaris rupture. Rate. Patient was to start increasing his activity slowly. Patient states the foot is now 100%. Starting to notice more discomfort in the back and the wrist again. Nothing that is stopping him from activities. 20 to know if he can have another manipulation.    Past medical history, social, surgical and family history all reviewed in electronic medical record.   Review of Systems: No headache, visual changes, nausea, vomiting, diarrhea, constipation, dizziness, abdominal pain, skin rash, fevers, chills, night sweats, weight loss, swollen lymph nodes, body aches, joint swelling, muscle aches, chest pain, shortness of breath, mood changes.   Objective Blood pressure 122/80, pulse 56, height 6\' 1"  (1.854 m), weight 198 lb (89.812 kg), SpO2 98 %.  General: No apparent distress alert and oriented x3 mood and affect normal, dressed appropriately.  HEENT: Pupils equal, extraocular movements intact  Respiratory: Patient's speak in full sentences and does not appear short of breath  Cardiovascular: No lower extremity edema, non tender, no erythema  Skin: Warm dry intact with no signs of infection or rash on extremities or on axial skeleton.  Abdomen: Soft nontender  Neuro: Cranial nerves II through XII are intact, neurovascularly intact in all extremities with 2+ DTRs and 2+ pulses.  Lymph: No lymphadenopathy of posterior or anterior cervical chain or axillae bilaterally.  Gait normal with good balance and coordination.  MSK:  Non tender with full range of motion and good stability and symmetric strength and tone of shoulders, elbows, wrist, hip, knees bilaterally.  Ankle: Left No visible  erythema or swelling. Range of motion is full in all directions. Strength is 5/5 in all directions. Stable lateral and medial ligaments; squeeze test and kleiger test unremarkable; Talar dome nontender; No pain at base of 5th MT; No tenderness over cuboid; No tenderness over N spot or navicular prominence No tenderness on posterior aspects of lateral and medial malleolus No sign of peroneal tendon subluxations or tenderness to palpation Negative tarsal tunnel tinel's Able to walk 4 steps. Digits were no longer has significant flattening compared to the contralateral side. Still moderately tender over the plantaris muscle. No significant inflammation. Neurovascular intact distally..  Back Exam:  Inspection: Unremarkable  Motion: Flexion 45 deg, Extension 35 deg, Side Bending to 35 deg bilaterally,  Rotation to 45 deg bilaterally  SLR laying: Negative  XSLR laying: Negative  Palpable tenderness: Nontender on exam FABER: negative. Sensory change: Gross sensation intact to all lumbar and sacral dermatomes.  Reflexes: 2+ at both patellar tendons, 2+ at achilles tendons, Babinski's downgoing.  Strength at foot  Plantar-flexion: 5/5 Dorsi-flexion: 5/5 Eversion: 5/5 Inversion: 5/5  Leg strength  Quad: 5/5 Hamstring: 5/5 Hip flexor: 5/5 with mild improvement in range of motion Hip abductors: 5 out of 5 Gait unremarkable. Continues to do well  Osteopathic findings  Cervical C2 flexed rotated inside that right  Thoracic T3 extended rotated and side bent left T8 extended rotated and side bent left  Lumbar L2 flexed rotated and side bent left  Sacrum Left on left      Impression and Recommendations:     This case required medical decision making of moderate complexity.

## 2015-03-20 NOTE — Assessment & Plan Note (Signed)
He can seems to be healed at this time. No restrictions anymore.

## 2015-03-20 NOTE — Progress Notes (Signed)
Pre visit review using our clinic review tool, if applicable. No additional management support is needed unless otherwise documented below in the visit note. 

## 2015-04-17 ENCOUNTER — Encounter: Payer: Self-pay | Admitting: Family Medicine

## 2015-04-17 ENCOUNTER — Ambulatory Visit (INDEPENDENT_AMBULATORY_CARE_PROVIDER_SITE_OTHER): Payer: BLUE CROSS/BLUE SHIELD | Admitting: Family Medicine

## 2015-04-17 VITALS — BP 122/78 | HR 65 | Ht 73.0 in | Wt 200.0 lb

## 2015-04-17 DIAGNOSIS — M545 Low back pain, unspecified: Secondary | ICD-10-CM

## 2015-04-17 DIAGNOSIS — M999 Biomechanical lesion, unspecified: Secondary | ICD-10-CM

## 2015-04-17 DIAGNOSIS — M9904 Segmental and somatic dysfunction of sacral region: Secondary | ICD-10-CM

## 2015-04-17 DIAGNOSIS — M9902 Segmental and somatic dysfunction of thoracic region: Secondary | ICD-10-CM

## 2015-04-17 DIAGNOSIS — M9903 Segmental and somatic dysfunction of lumbar region: Secondary | ICD-10-CM

## 2015-04-17 NOTE — Progress Notes (Signed)
Pre visit review using our clinic review tool, if applicable. No additional management support is needed unless otherwise documented below in the visit note. 

## 2015-04-17 NOTE — Patient Instructions (Signed)
Good to see you Ice 20 minutes 2 times daily. Usually after activity and before bed. Stay active Watch the spot and try the  pennsaid See me again 4 weeks.

## 2015-04-17 NOTE — Assessment & Plan Note (Signed)
Decision today to treat with OMT was based on Physical Exam  After verbal consent patient was treated with HVLA, ME techniques in thoracic, lumbar and sacral areas  Patient tolerated the procedure well with improvement in symptoms  Patient given exercises, stretches and lifestyle modifications  See medications in patient instructions if given  Patient will follow up in 4-6 weeks          

## 2015-04-17 NOTE — Assessment & Plan Note (Signed)
Some mild increased tightness in the erector spinae muscle. Patient given some other range of motion exercises that may be more beneficial. Continues to have some mild tightness of the hip flexors. We discussed icing regimen. Patient will come back and see me again in 4-6 weeks for further evaluation and treatment.

## 2015-04-17 NOTE — Progress Notes (Signed)
[name] [address] [place], [place] [address] Phone: [phone] Subjective:     CC: Back pain follow up, wrist pain follow-up  BJY:NWGNFAOZHY Shawn Mason is a 29 y.o. male coming in for followup of low back pain. Patient does CrossFit avidly. Patient overall has been doing really well. Patient has been doing a lot lighter weights with increased repetitions. Feels like it seems to be more beneficial. Is not having as much is a soreness or any increasing muscle spasms. Overall better than he has been in multiple follow-ups.   Patient did have some left wrist pain. Patient states that the pain is completely resolved.     Past medical history, social, surgical and family history all reviewed in electronic medical record.   Review of Systems: No headache, visual changes, nausea, vomiting, diarrhea, constipation, dizziness, abdominal pain, skin rash, fevers, chills, night sweats, weight loss, swollen lymph nodes, body aches, joint swelling, muscle aches, chest pain, shortness of breath, mood changes.   Objective Blood pressure 122/78, pulse 65, height  (1.854 m), weight 200 lb (90.719 kg), SpO2 98 %.  General: No apparent distress alert and oriented x3 mood and affect normal, dressed appropriately.  HEENT: Pupils equal, extraocular movements intact  Respiratory: Patient's speak in full sentences and does not appear short of breath  Cardiovascular: No lower extremity edema, non tender, no erythema  Skin: Warm dry intact with no signs of infection or rash on extremities or on axial skeleton.  Abdomen: Soft nontender  Neuro: Cranial nerves II through XII are intact, neurovascularly intact in all extremities with 2+ DTRs and 2+ pulses.  Lymph: No lymphadenopathy of posterior or anterior cervical chain or axillae bilaterally.  Gait normal with good balance and coordination.  MSK:  Non tender with full range of motion and good stability and  symmetric strength and tone of shoulders,   hip, knee and ankles bilaterally.  Wrist: Left Inspection normal with no visible erythema or swelling. ROM smooth and normal with good flexion and extension and ulnar/radial deviation that is symmetrical with opposite wrist. Minimal discomfort with fibular deviation and extension of the wrist as well as some deep palpation around just proximal to the [facility] joint. No significant pain though over the scaphoid bone itself. Palpation is normal over metacarpals, navicular, lunate, and TFCC; tendons without tenderness/ swelling Very minimal tenderness over the snuff box more pain on the dorsal aspect No tenderness over Canal of Guyon. Strength 5/5 in all directions without pain. Negative Finkelstein, tinel's and phalens. Negative Watson's test.  Back Exam:  Inspection: Unremarkable  Motion: Flexion 45 deg, Extension 35 deg, Side Bending to 35 deg bilaterally,  Rotation to 45 deg bilaterally  SLR laying: Negative  XSLR laying: Negative  Palpable tenderness: Nontender on exam FABER: negative. Sensory change: Gross sensation intact to all lumbar and sacral dermatomes.  Reflexes: 2+ at both patellar tendons, 2+ at achilles tendons, Babinski's downgoing.  Strength at foot  Plantar-flexion: 5/5 Dorsi-flexion: 5/5 Eversion: 5/5 Inversion: 5/5  Leg strength  Quad: 5/5 Hamstring: 5/5 Hip flexor: 5/5 with mild improvement in range of motion Hip abductors: 5 out of 5 Gait unremarkable. Continues to do well  Osteopathic findings  Cervical C2 flexed rotated inside that right  Thoracic T3 extended rotated and side bent left T8 extended rotated and side bent left   Lumbar L1 flexed rotated and side bent right L2 flexed rotated and side bent left  Sacrum Left on left Same  pattern as previously    Impression and Recommendations:     This case required medical decision making of moderate complexity.

## 2015-05-13 ENCOUNTER — Encounter: Payer: Self-pay | Admitting: Family Medicine

## 2015-05-13 ENCOUNTER — Ambulatory Visit (INDEPENDENT_AMBULATORY_CARE_PROVIDER_SITE_OTHER): Payer: BLUE CROSS/BLUE SHIELD | Admitting: Family Medicine

## 2015-05-13 VITALS — BP 120/82 | HR 57 | Ht 73.0 in | Wt 200.0 lb

## 2015-05-13 DIAGNOSIS — M999 Biomechanical lesion, unspecified: Secondary | ICD-10-CM

## 2015-05-13 DIAGNOSIS — M545 Low back pain, unspecified: Secondary | ICD-10-CM

## 2015-05-13 DIAGNOSIS — M9903 Segmental and somatic dysfunction of lumbar region: Secondary | ICD-10-CM | POA: Diagnosis not present

## 2015-05-13 DIAGNOSIS — M9902 Segmental and somatic dysfunction of thoracic region: Secondary | ICD-10-CM | POA: Diagnosis not present

## 2015-05-13 DIAGNOSIS — M9904 Segmental and somatic dysfunction of sacral region: Secondary | ICD-10-CM | POA: Diagnosis not present

## 2015-05-13 NOTE — Assessment & Plan Note (Signed)
Decision today to treat with OMT was based on Physical Exam  After verbal consent patient was treated with HVLA, ME techniques in thoracic, lumbar and sacral areas  Patient tolerated the procedure well with improvement in symptoms  Patient given exercises, stretches and lifestyle modifications  See medications in patient instructions if given  Patient will follow up in 6 weeks      

## 2015-05-13 NOTE — Assessment & Plan Note (Signed)
Overall doing relatively well. Patient is dominant on the right side and I think that this is giving him some mild increasing tightness. We discussed ergonomics at work. We discussed different stretches that could be more beneficial. Patient then will continue anything else that he is doing at this point and come back in 6-8 weeks for further evaluation and treatment.

## 2015-05-13 NOTE — Progress Notes (Signed)
   Tawana ScaleZach Chi Mason D.O. [facility] [address] [place], [place] [address] Phone: [phone]([phone] Subjective:     CC: Back pain follow up, wrist pain follow-up  ZHY:QMVHQIONGEHPI:Subjective [name] is a 30 y.o. male coming in for followup of low back pain. Patient does CrossFit avidly. Patient was having some mild increase in upper right-sided back pain. Seems to decrease over the course of time now. Patient states that he is feeling somewhat better. Denies any numbness or tingling. States that he is able to do all the lifting. Not affecting daily activities at this time.   Past medical history, social, surgical and family history all reviewed and no pertinent info pertaining to chief complaint. All other was reviewed using the EMR.    Review of Systems: No headache, visual changes, nausea, vomiting, diarrhea, constipation, dizziness, abdominal pain, skin rash, fevers, chills, night sweats, weight loss, swollen lymph nodes, body aches, joint swelling, muscle aches, chest pain, shortness of breath, mood changes.   Objective Blood pressure 120/82, pulse 57, height 6\' 1"  (1.854 m), weight 200 lb (90.719 kg), SpO2 99 %.  General: No apparent distress alert and oriented x3 mood and affect normal, dressed appropriately.  HEENT: Pupils equal, extraocular movements intact  Respiratory: Patient's speak in full sentences and does not appear short of breath  Cardiovascular: No lower extremity edema, non tender, no erythema  Skin: Warm dry intact with no signs of infection or rash on extremities or on axial skeleton.  Abdomen: Soft nontender  Neuro: Cranial nerves II through XII are intact, neurovascularly intact in all extremities with 2+ DTRs and 2+ pulses.  Lymph: No lymphadenopathy of posterior or anterior cervical chain or axillae bilaterally.  Gait normal with good balance and coordination.  MSK:  Non tender with full range of motion and good stability and symmetric strength and tone of  shoulders,   hip, knee and ankles bilaterally.    Back Exam:  Inspection: Unremarkable  Motion: Flexion 45 deg, Extension 35 deg, Side Bending to 35 deg bilaterally,  Rotation to 45 deg bilaterally  SLR laying: Negative  XSLR laying: Negative  Palpable tenderness: Nontender on exam FABER: negative. Sensory change: Gross sensation intact to all lumbar and sacral dermatomes.  Reflexes: 2+ at both patellar tendons, 2+ at achilles tendons, Babinski's downgoing.  Strength at foot  Plantar-flexion: 5/5 Dorsi-flexion: 5/5 Eversion: 5/5 Inversion: 5/5  Leg strength  Quad: 5/5 Hamstring: 5/5 Hip flexor: 5/5  Hip abductors: 5 out of 5 Gait unremarkable. Possibly mild improvement than previous exam  Osteopathic findings  Cervical C2 flexed rotated inside that right C6 flexed rotated and side bent left  Thoracic T3 extended rotated and side bent left T7 extended rotated and side bent left   Lumbar L1 flexed rotated and side bent right L4 flexed rotated and side bent left  Sacrum Left on left Mild change from previous    Impression and Recommendations:     This case required medical decision making of moderate complexity.

## 2015-05-13 NOTE — Patient Instructions (Addendum)
Great to see you You are doing great overall.   Consider a kneeling chair (just kidding) Watch posture through the day.  Ice is your friend when needed Stay active and do what you do.  See me again in 6 weeks Happy New Year!

## 2015-05-13 NOTE — Progress Notes (Signed)
Pre visit review using our clinic review tool, if applicable. No additional management support is needed unless otherwise documented below in the visit note. 

## 2015-06-24 ENCOUNTER — Encounter: Payer: Self-pay | Admitting: Family Medicine

## 2015-06-24 ENCOUNTER — Ambulatory Visit (INDEPENDENT_AMBULATORY_CARE_PROVIDER_SITE_OTHER): Payer: BLUE CROSS/BLUE SHIELD | Admitting: Family Medicine

## 2015-06-24 VITALS — BP 128/84 | HR 47 | Ht 73.0 in | Wt 202.0 lb

## 2015-06-24 DIAGNOSIS — M9903 Segmental and somatic dysfunction of lumbar region: Secondary | ICD-10-CM

## 2015-06-24 DIAGNOSIS — M999 Biomechanical lesion, unspecified: Secondary | ICD-10-CM

## 2015-06-24 DIAGNOSIS — M545 Low back pain, unspecified: Secondary | ICD-10-CM

## 2015-06-24 DIAGNOSIS — M9902 Segmental and somatic dysfunction of thoracic region: Secondary | ICD-10-CM

## 2015-06-24 DIAGNOSIS — M9904 Segmental and somatic dysfunction of sacral region: Secondary | ICD-10-CM

## 2015-06-24 DIAGNOSIS — M24559 Contracture, unspecified hip: Secondary | ICD-10-CM | POA: Diagnosis not present

## 2015-06-24 NOTE — Assessment & Plan Note (Signed)
Mild improvement. Nothing that is debilitating.

## 2015-06-24 NOTE — Assessment & Plan Note (Signed)
Patient is been doing significantly better at this time. Discussed icing regimen. Patient saw has some mild tight hip flexors but improving. Patient is making significant change. I do not see any significant changes we need to make them. We'll have patient come back again in 8 weeks for further evaluation and treatment.

## 2015-06-24 NOTE — Patient Instructions (Signed)
Good to see you Ice is your friend when you need it You are doing amazing and I got nothing to add Lets space you out to 8-9 weeks.

## 2015-06-24 NOTE — Assessment & Plan Note (Signed)
Decision today to treat with OMT was based on Physical Exam  After verbal consent patient was treated with HVLA, ME techniques in thoracic, lumbar and sacral areas  Patient tolerated the procedure well with improvement in symptoms  Patient given exercises, stretches and lifestyle modifications  See medications in patient instructions if given  Patient will follow up in 8 weeks                        

## 2015-06-24 NOTE — Progress Notes (Signed)
   [name] [address] [place], [place] [address] Phone: [phone] Subjective:     CC: Back pain follow up, wrist pain follow-up  BJY:Shawn Mason is a 30 y.o. male coming in for followup of low back pain. Patient does CrossFit avidly. Patient continues to be active. Patient states this last 6 week interval he has not had any significant discomfort. He has continuing to lift heavy weights. No synovium pain in the back that is stopping him from activities. Overall very happy with the results. Continues the vitamin supplementations. Not taking any prescribed medications.   Past medical history, social, surgical and family history all reviewed and no pertinent info pertaining to chief complaint. All other was reviewed using the EMR.    Review of Systems: No headache, visual changes, nausea, vomiting, diarrhea, constipation, dizziness, abdominal pain, skin rash, fevers, chills, night sweats, weight loss, swollen lymph nodes, body aches, joint swelling, muscle aches, chest pain, shortness of breath, mood changes.   Objective Blood pressure 128/84, pulse 47, weight 202 lb (91.627 kg), SpO2 99 %.  General: No apparent distress alert and oriented x3 mood and affect normal, dressed appropriately.  HEENT: Pupils equal, extraocular movements intact  Respiratory: Patient's speak in full sentences and does not appear short of breath  Cardiovascular: No lower extremity edema, non tender, no erythema  Skin: Warm dry intact with no signs of infection or rash on extremities or on axial skeleton.  Abdomen: Soft nontender  Neuro: Cranial nerves II through XII are intact, neurovascularly intact in all extremities with 2+ DTRs and 2+ pulses.  Lymph: No lymphadenopathy of posterior or anterior cervical chain or axillae bilaterally.  Gait normal with good balance and coordination.  MSK:  Non tender with full range of motion and good stability and symmetric  strength and tone of shoulders,   hip, knee and ankles bilaterally.    Back Exam:  Inspection: Unremarkable  Motion: Flexion 45 deg, Extension 35 deg, Side Bending to 35 deg bilaterally,  Rotation to 45 deg bilaterally  SLR laying: Negative  XSLR laying: Negative  Palpable tenderness: Nontender on exam FABER: negative. Sensory change: Gross sensation intact to all lumbar and sacral dermatomes.  Reflexes: 2+ at both patellar tendons, 2+ at achilles tendons, Babinski's downgoing.  Strength at foot  Plantar-flexion: 5/5 Dorsi-flexion: 5/5 Eversion: 5/5 Inversion: 5/5  Leg strength  Quad: 5/5 Hamstring: 5/5 Hip flexor: 5/5  Hip abductors: 5 out of 5 and symmetric Gait unremarkable.   Osteopathic findings  Cervical C2 flexed rotated inside that right C7 flexed rotated and side bent left  Thoracic T3 extended rotated and side bent left T7 extended rotated and side bent left   Lumbar L2 flexed rotated and side bent right L4 flexed rotated and side bent left  Sacrum Left on left Mild change from previous    Impression and Recommendations:     This case required medical decision making of moderate complexity.

## 2015-06-24 NOTE — Progress Notes (Signed)
Pre visit review using our clinic review tool, if applicable. No additional management support is needed unless otherwise documented below in the visit note. 

## 2015-08-19 ENCOUNTER — Ambulatory Visit (INDEPENDENT_AMBULATORY_CARE_PROVIDER_SITE_OTHER): Payer: BLUE CROSS/BLUE SHIELD | Admitting: Family Medicine

## 2015-08-19 ENCOUNTER — Encounter: Payer: Self-pay | Admitting: Family Medicine

## 2015-08-19 VITALS — BP 122/82 | HR 54 | Wt 189.0 lb

## 2015-08-19 DIAGNOSIS — M545 Low back pain, unspecified: Secondary | ICD-10-CM

## 2015-08-19 DIAGNOSIS — M9903 Segmental and somatic dysfunction of lumbar region: Secondary | ICD-10-CM | POA: Diagnosis not present

## 2015-08-19 DIAGNOSIS — M9902 Segmental and somatic dysfunction of thoracic region: Secondary | ICD-10-CM

## 2015-08-19 DIAGNOSIS — M999 Biomechanical lesion, unspecified: Secondary | ICD-10-CM

## 2015-08-19 DIAGNOSIS — M9904 Segmental and somatic dysfunction of sacral region: Secondary | ICD-10-CM | POA: Diagnosis not present

## 2015-08-19 NOTE — Assessment & Plan Note (Signed)
Decision today to treat with OMT was based on Physical Exam  After verbal consent patient was treated with HVLA, ME techniques in thoracic, lumbar and sacral areas  Patient tolerated the procedure well with improvement in symptoms  Patient given exercises, stretches and lifestyle modifications  See medications in patient instructions if given  Patient will follow up in 10 weeks                

## 2015-08-19 NOTE — Patient Instructions (Signed)
You are doing awesome Stay active  Do what you are doing and good luck on the yard See me again in 10 weeks

## 2015-08-19 NOTE — Progress Notes (Signed)
   Tawana ScaleZach Mason D.O. [facility] [address] [place], [place] [address] Phone: [phone] Subjective:     CC: Back pain follow up,   YQI:HKVQQVZDGLHPI:Subjective Shawn Mason is a 30 y.o. male coming in for followup of low back pain. Patient does CrossFit avidly. Then 8 weeks since we seen patient. Been doing very well. Some mild upper back pain but nothing that seems to be severe. Continues to be active. No radiation down the arms. Sleeping comfortably. Doing all activities without any problems.   Past Medical History  Diagnosis Date  . Asthma    No past surgical history on file. Social History  Substance Use Topics  . Smoking status: Never Smoker   . Smokeless tobacco: Never Used  . Alcohol Use: Yes   No Known Allergies No family history on file.no family history of rheumatological diseases    Review of Systems: No headache, visual changes, nausea, vomiting, diarrhea, constipation, dizziness, abdominal pain, skin rash, fevers, chills, night sweats, weight loss, swollen lymph nodes, body aches, joint swelling, muscle aches, chest pain, shortness of breath, mood changes.   Objective Blood pressure 122/82, pulse 54, weight 189 lb (85.73 kg).  General: No apparent distress alert and oriented x3 mood and affect normal, dressed appropriately.  HEENT: Pupils equal, extraocular movements intact  Respiratory: Patient's speak in full sentences and does not appear short of breath  Cardiovascular: No lower extremity edema, non tender, no erythema  Skin: Warm dry intact with no signs of infection or rash on extremities or on axial skeleton.  Abdomen: Soft nontender  Neuro: Cranial nerves II through XII are intact, neurovascularly intact in all extremities with 2+ DTRs and 2+ pulses.  Lymph: No lymphadenopathy of posterior or anterior cervical chain or axillae bilaterally.  Gait normal with good balance and coordination.  MSK:  Non tender with full range of motion and good stability  and symmetric strength and tone of shoulders,   hip, knee and ankles bilaterally.    Back Exam:  Inspection: Unremarkable  Motion: Flexion 45 deg, Extension 35 deg, Side Bending to 35 deg bilaterally,  Rotation to 45 deg bilaterally  SLR laying: Negative  XSLR laying: Negative  Palpable tenderness: Nontender on exam FABER: negative. Sensory change: Gross sensation intact to all lumbar and sacral dermatomes.  Reflexes: 2+ at both patellar tendons, 2+ at achilles tendons, Babinski's downgoing.  Strength at foot  Plantar-flexion: 5/5 Dorsi-flexion: 5/5 Eversion: 5/5 Inversion: 5/5  Leg strength  Quad: 5/5 Hamstring: 5/5 Hip flexor: 5/5  Hip abductors: 5 out of 5 and symmetric Gait unremarkable.   Osteopathic findings  Cervical C2 flexed rotated inside that right C6 flexed rotated and side bent left  Thoracic T3 extended rotated and side bent left T5 extended rotated and side bent left   Lumbar L2 flexed rotated and side bent right L4 flexed rotated and side bent left  Sacrum Left on left     Impression and Recommendations:     This case required medical decision making of moderate complexity.

## 2015-08-19 NOTE — Assessment & Plan Note (Signed)
Doing significantly better at this time. I do not have any significant changes. Patient will be spaced out to 10 week intervals. Continue with same regimen otherwise.

## 2015-10-28 ENCOUNTER — Ambulatory Visit: Payer: BLUE CROSS/BLUE SHIELD | Admitting: Family Medicine

## 2015-11-18 ENCOUNTER — Encounter: Payer: Self-pay | Admitting: Family Medicine

## 2015-11-18 ENCOUNTER — Ambulatory Visit (INDEPENDENT_AMBULATORY_CARE_PROVIDER_SITE_OTHER): Payer: Self-pay | Admitting: Family Medicine

## 2015-11-18 VITALS — BP 126/82 | HR 50 | Ht 73.0 in | Wt 203.0 lb

## 2015-11-18 DIAGNOSIS — M9902 Segmental and somatic dysfunction of thoracic region: Secondary | ICD-10-CM

## 2015-11-18 DIAGNOSIS — M999 Biomechanical lesion, unspecified: Secondary | ICD-10-CM

## 2015-11-18 DIAGNOSIS — M9903 Segmental and somatic dysfunction of lumbar region: Secondary | ICD-10-CM

## 2015-11-18 DIAGNOSIS — M24559 Contracture, unspecified hip: Secondary | ICD-10-CM

## 2015-11-18 DIAGNOSIS — M9904 Segmental and somatic dysfunction of sacral region: Secondary | ICD-10-CM

## 2015-11-18 NOTE — Progress Notes (Signed)
Pre visit review using our clinic review tool, if applicable. No additional management support is needed unless otherwise documented below in the visit note. 

## 2015-11-18 NOTE — Patient Instructions (Addendum)
You are doing great  CONGRATS so happy for you guys Don't change a thing See me again in 3 months You might need me more frequently after you have the baby!

## 2015-11-18 NOTE — Assessment & Plan Note (Signed)
Decision today to treat with OMT was based on Physical Exam  After verbal consent patient was treated with HVLA, ME techniques in thoracic, lumbar and sacral areas  Patient tolerated the procedure well with improvement in symptoms  Patient given exercises, stretches and lifestyle modifications  See medications in patient instructions if given  Patient will follow up in 12 weeks

## 2015-11-18 NOTE — Assessment & Plan Note (Signed)
Seems stable and momentand change management. Encourage patient to continue the core strengthening as well as the stretching. We discussed icing regimen. Follow-up again in 3 months.

## 2015-11-18 NOTE — Progress Notes (Signed)
   Tawana ScaleZach Shawn Mason D.O. [facility] [address] [place], [place] [address] Phone: [phone] Subjective:     CC: Back pain follow up  BJY:NWGNFAOZHYHPI:Subjective [name] is a 30 y.o. male coming in for followup of low back pain. Patient does CrossFit avidly. Patient continues to be active. Doing a lot of water skiing. Mild tightness of the hip flexors the does not do the stretching. Overall though seems to be doing relatively well. No significant complaints.   Past Medical History  Diagnosis Date  . Asthma    No past surgical history on file. Social History  Substance Use Topics  . Smoking status: Never Smoker   . Smokeless tobacco: Never Used  . Alcohol Use: Yes   No Known Allergies No family history on file.no family history of rheumatological diseases    Review of Systems: No headache, visual changes, nausea, vomiting, diarrhea, constipation, dizziness, abdominal pain, skin rash, fevers, chills, night sweats, weight loss, swollen lymph nodes, body aches, joint swelling, muscle aches, chest pain, shortness of breath, mood changes.   Objective Blood pressure 126/82, pulse 50, height 6\' 1"  (1.854 m), weight 203 lb (92.08 kg), SpO2 95 %.  General: No apparent distress alert and oriented x3 mood and affect normal, dressed appropriately.  HEENT: Pupils equal, extraocular movements intact  Respiratory: Patient's speak in full sentences and does not appear short of breath  Cardiovascular: No lower extremity edema, non tender, no erythema  Skin: Warm dry intact with no signs of infection or rash on extremities or on axial skeleton.  Abdomen: Soft nontender  Neuro: Cranial nerves II through XII are intact, neurovascularly intact in all extremities with 2+ DTRs and 2+ pulses.  Lymph: No lymphadenopathy of posterior or anterior cervical chain or axillae bilaterally.  Gait normal with good balance and coordination.  MSK:  Non tender with full range of motion and good stability and  symmetric strength and tone of shoulders,   hip, knee and ankles bilaterally.    Back Exam:  Inspection: Unremarkable  Motion: Flexion 45 deg, Extension 35 deg, Side Bending to 35 deg bilaterally,  Rotation to 45 deg bilaterally  SLR laying: Negative  XSLR laying: Negative  Palpable tenderness: Continued mild tightness of the lumbar spine of the hip flexors bilaterally FABER: negative. Sensory change: Gross sensation intact to all lumbar and sacral dermatomes.  Reflexes: 2+ at both patellar tendons, 2+ at achilles tendons, Babinski's downgoing.  Strength at foot  Plantar-flexion: 5/5 Dorsi-flexion: 5/5 Eversion: 5/5 Inversion: 5/5  Leg strength  Quad: 5/5 Hamstring: 5/5 Hip flexor: 5/5  Hip abductors: 5 out of 5 and symmetric Gait unremarkable.   Osteopathic findings  Cervical C2 flexed rotated inside that right C4 flexed rotated and side bent left  Thoracic T3 extended rotated and side bent left T5 extended rotated and side bent left   Lumbar L2 flexed rotated and side bent right L5 flexed rotated and side bent left  Sacrum Left on left     Impression and Recommendations:     This case required medical decision making of moderate complexity.

## 2016-02-17 NOTE — Assessment & Plan Note (Signed)
Decision today to treat with OMT was based on Physical Exam  After verbal consent patient was treated with HVLA, ME techniques in thoracic, lumbar and sacral areas  Patient tolerated the procedure well with improvement in symptoms  Patient given exercises, stretches and lifestyle modifications  See medications in patient instructions if given  Patient will follow up in 12 weeks                                            

## 2016-02-17 NOTE — Progress Notes (Signed)
   Tawana ScaleZach Franchelle Foskett D.O. [facility] [address] [place], [place] [address] Phone: [phone]([phone] Subjective:     CC: Back pain follow up  VHQ:IONGEXBMWUHPI:Subjective  [name] is a 30 y.o. male coming in for followup of low back pain. Patient does CrossFit avidly. Patient continues to be active.  Overall though seems to be doing relatively well. No significant complaints. Patient states that he feels good overall with no changes.  Continues to concentrate on hip flexors.  Past Medical History:  Diagnosis Date  . Asthma    No past surgical history on file. Social History  Substance Use Topics  . Smoking status: Never Smoker  . Smokeless tobacco: Never Used  . Alcohol use Yes   No Known Allergies No family history on file.no family history of rheumatological diseases    Review of Systems: No headache, visual changes, nausea, vomiting, diarrhea, constipation, dizziness, abdominal pain, skin rash, fevers, chills, night sweats, weight loss, swollen lymph nodes, body aches, joint swelling, muscle aches, chest pain, shortness of breath, mood changes.   Objective  Weight 199 lb (90.3 kg).  General: No apparent distress alert and oriented x3 mood and affect normal, dressed appropriately.  HEENT: Pupils equal, extraocular movements intact  Respiratory: Patient's speak in full sentences and does not appear short of breath  Cardiovascular: No lower extremity edema, non tender, no erythema  Skin: Warm dry intact with no signs of infection or rash on extremities or on axial skeleton.  Abdomen: Soft nontender  Neuro: Cranial nerves II through XII are intact, neurovascularly intact in all extremities with 2+ DTRs and 2+ pulses.  Lymph: No lymphadenopathy of posterior or anterior cervical chain or axillae bilaterally.  Gait normal with good balance and coordination.  MSK:  Non tender with full range of motion and good stability and symmetric strength and tone of shoulders,   hip, knee and  ankles bilaterally.    Back Exam:  Inspection: Unremarkable  Motion: Flexion 45 deg, Extension 35 deg, Side Bending to 35 deg bilaterally,  Rotation to 45 deg bilaterally  SLR laying: Negative  XSLR laying: Negative  Palpable tenderness: None today FABER: negative. Sensory change: Gross sensation intact to all lumbar and sacral dermatomes.  Reflexes: 2+ at both patellar tendons, 2+ at achilles tendons, Babinski's downgoing.  Strength at foot  Plantar-flexion: 5/5 Dorsi-flexion: 5/5 Eversion: 5/5 Inversion: 5/5  Leg strength  Quad: 5/5 Hamstring: 5/5 Hip flexor: 5/5  Hip abductors: 5 out of 5  Gait unremarkable.   Osteopathic findings  Cervical C2 flexed rotated inside that right C6 flexed rotated and side bent left  Thoracic T3 extended rotated and side bent left T7 extended rotated and side bent left   Lumbar L2 flexed rotated and side bent right  Sacrum Left on left     Impression and Recommendations:     This case required medical decision making of moderate complexity.

## 2016-02-17 NOTE — Assessment & Plan Note (Signed)
She has been doing remarkably well. Patient will start to be likely stressed out on a more regular basis. We discussed icing regimen and home exercises. Discussed which activities would be more beneficial. Patient will continue with conservative therapy and see me every 2-3 months.

## 2016-02-18 ENCOUNTER — Ambulatory Visit (INDEPENDENT_AMBULATORY_CARE_PROVIDER_SITE_OTHER): Payer: Self-pay | Admitting: Family Medicine

## 2016-02-18 ENCOUNTER — Encounter: Payer: Self-pay | Admitting: Family Medicine

## 2016-02-18 VITALS — Wt 199.0 lb

## 2016-02-18 DIAGNOSIS — M545 Low back pain: Secondary | ICD-10-CM

## 2016-02-18 DIAGNOSIS — G8929 Other chronic pain: Secondary | ICD-10-CM

## 2016-02-18 DIAGNOSIS — M999 Biomechanical lesion, unspecified: Secondary | ICD-10-CM

## 2016-02-18 NOTE — Patient Instructions (Signed)
Good to see you  You are doing great  So excited for you guys See me again in 3 months

## 2016-05-20 ENCOUNTER — Ambulatory Visit (INDEPENDENT_AMBULATORY_CARE_PROVIDER_SITE_OTHER): Payer: Self-pay | Admitting: Family Medicine

## 2016-05-20 ENCOUNTER — Encounter: Payer: Self-pay | Admitting: Family Medicine

## 2016-05-20 VITALS — BP 122/82 | HR 58

## 2016-05-20 DIAGNOSIS — M545 Low back pain, unspecified: Secondary | ICD-10-CM

## 2016-05-20 DIAGNOSIS — G8929 Other chronic pain: Secondary | ICD-10-CM

## 2016-05-20 DIAGNOSIS — M999 Biomechanical lesion, unspecified: Secondary | ICD-10-CM

## 2016-05-20 NOTE — Progress Notes (Signed)
   Tawana ScaleZach Smith D.O. [facility] [address] [place], [place] [address] Phone: [phone] Subjective:     CC: Back pain follow up  OZH:YQMVHQIONGHPI:Subjective  Shawn Mason is a 31 y.o. male coming in for followup of low back pain. Patient does CrossFit avidly.Patient received became a father and has not been doing exercises as regularly.  Past Medical History:  Diagnosis Date  . Asthma    No past surgical history on file. Social History  Substance Use Topics  . Smoking status: Never Smoker  . Smokeless tobacco: Never Used  . Alcohol use Yes   No Known Allergies No family history on file.no family history of rheumatological diseases    Review of Systems: No headache, visual changes, nausea, vomiting, diarrhea, constipation, dizziness, abdominal pain, skin rash, fevers, chills, night sweats, weight loss, swollen lymph nodes, body aches, joint swelling, muscle aches, chest pain, shortness of breath, mood changes.    Objective  Blood pressure 122/82, pulse (!) 58, SpO2 97 %.  Systems examined below as of 05/20/16 General: NAD A&O x3 mood, affect normal  HEENT: Pupils equal, extraocular movements intact no nystagmus Respiratory: not short of breath at rest or with speaking Cardiovascular: No lower extremity edema, non tender Skin: Warm dry intact with no signs of infection or rash on extremities or on axial skeleton. Abdomen: Soft nontender, no masses Neuro: Cranial nerves  intact, neurovascularly intact in all extremities with 2+ DTRs and 2+ pulses. Lymph: No lymphadenopathy appreciated today  Gait normal with good balance and coordination.  MSK: Non tender with full range of motion and good stability and symmetric strength and tone of shoulders, elbows, wrist,  knee hips and ankles bilaterally.     Back Exam:  Inspection: Unremarkable  Motion: Flexion 45 deg, Extension 25 deg, Side Bending to 35 deg bilaterally,  Rotation to 45 deg bilaterally  SLR laying: Negative    XSLR laying: Negative  Palpable tenderness: None today FABER: negative. Sensory change: Gross sensation intact to all lumbar and sacral dermatomes.  Reflexes: 2+ at both patellar tendons, 2+ at achilles tendons, Babinski's downgoing.  Strength at foot  Plantar-flexion: 5/5 Dorsi-flexion: 5/5 Eversion: 5/5 Inversion: 5/5  Leg strength  Quad: 5/5 Hamstring: 5/5 Hip flexor: 5/5  Hip abductors: 5 out of 5  Gait unremarkable.   Osteopathic findings  Cervical C2 flexed rotated inside that right C6 flexed rotated and side bent left  Thoracic T3 extended rotated and side bent left T7 extended rotated and side bent left   Lumbar L2 flexed rotated and side bent right  Sacrum Left on left     Impression and Recommendations:     This case required medical decision making of moderate complexity.

## 2016-05-20 NOTE — Assessment & Plan Note (Signed)
Decision today to treat with OMT was based on Physical Exam  After verbal consent patient was treated with HVLA, ME techniques in thoracic, lumbar and sacral areas  Patient tolerated the procedure well with improvement in symptoms  Patient given exercises, stretches and lifestyle modifications  See medications in patient instructions if given  Patient will follow up in 12 weeks                                            

## 2016-05-20 NOTE — Assessment & Plan Note (Addendum)
Doing well. NO numbness no radiation of the pain. Continue current therapy. Follow-up again in 3 months

## 2016-05-20 NOTE — Patient Instructions (Signed)
Good to see you  You are doing great  See me again in 3 months!

## 2017-12-11 NOTE — Progress Notes (Signed)
Tawana ScaleZach Ashly Yepez D.O. [facility] [address] [place], [place] [address] Phone: [phone] Subjective:     CC: Back pain follow-up  OZH:YQMVHQIONGHPI:Subjective  Shawn ParcelDavid R Lantzy is a 32 y.o. male coming in with complaint of back pain. States that he is not here for nothing specific. He has some questions today.  Patient has been seen previously and intact hip flexors.  Continues to workout on a regular basis.  Patient continues to do heavy lifting.  Has been doing the stretches that seem to do fairly well but is having some tightness overall.  Complaining of some right knee pain.  Medial.  Sometimes audible popping sensation.  And seems to be better when he runs up.  Sometimes some feels like there is a locking mechanism.  Denies any real injury.  Denies any swelling       Past Medical History:  Diagnosis Date  . Asthma    No past surgical history on file. Social History   Socioeconomic History  . Marital status: Single    Spouse name: Not on file  . Number of children: Not on file  . Years of education: Not on file  . Highest education level: Not on file  Occupational History  . Not on file  Social Needs  . Financial resource strain: Not on file  . Food insecurity:    Worry: Not on file    Inability: Not on file  . Transportation needs:    Medical: Not on file    Non-medical: Not on file  Tobacco Use  . Smoking status: Never Smoker  . Smokeless tobacco: Never Used  Substance and Sexual Activity  . Alcohol use: Yes  . Drug use: No  . Sexual activity: Not on file  Lifestyle  . Physical activity:    Days per week: Not on file    Minutes per session: Not on file  . Stress: Not on file  Relationships  . Social connections:    Talks on phone: Not on file    Gets together: Not on file    Attends religious service: Not on file    Active member of club or organization: Not on file    Attends meetings of clubs or organizations: Not on file    Relationship status: Not on  file  Other Topics Concern  . Not on file  Social History Narrative  . Not on file   No Known Allergies No family history on file.   Past medical history, social, surgical and family history all reviewed in electronic medical record.  No pertanent information unless stated regarding to the chief complaint.   Review of Systems:Review of systems updated and as accurate as of 12/11/17  No headache, visual changes, nausea, vomiting, diarrhea, constipation, dizziness, abdominal pain, skin rash, fevers, chills, night sweats, weight loss, swollen lymph nodes, body aches, joint swelling, muscle aches, chest pain, shortness of breath, mood changes.   Objective  There were no vitals taken for this visit. Systems examined below as of 12/11/17   General: No apparent distress alert and oriented x3 mood and affect normal, dressed appropriately.  HEENT: Pupils equal, extraocular movements intact  Respiratory: Patient's speak in full sentences and does not appear short of breath  Cardiovascular: No lower extremity edema, non tender, no erythema  Skin: Warm dry intact with no signs of infection or rash on extremities or on axial skeleton.  Abdomen: Soft nontender  Neuro: Cranial nerves II through XII are intact, neurovascularly intact  in all extremities with 2+ DTRs and 2+ pulses.  Lymph: No lymphadenopathy of posterior or anterior cervical chain or axillae bilaterally.  Gait normal with good balance and coordination.  MSK:  Non tender with full range of motion and good stability and symmetric strength and tone of shoulders, elbows, wrist, hip, and ankles bilaterally.   Knee:right  Normal to inspection with no erythema or effusion or obvious bony abnormalities. Palpation normal with no warmth, joint line tenderness, patellar tenderness, or condyle tenderness. ROM full in flexion and extension and lower leg rotation. Ligaments with solid consistent endpoints including ACL, PCL, LCL, MCL. Negative  Mcmurray's, Apley's, and Thessalonian tests. Non painful patellar compression. Painful though medial aspect plica  Patellar glide without crepitus. Patellar and quadriceps tendons unremarkable. Hamstring and quadriceps strength is normal.  Back Exam:  Inspection: Unremarkable  Motion: Flexion 45 deg, Extension 25 deg, Side Bending to 35 deg bilaterally,  Rotation to 45 deg bilaterally  SLR laying: Negative  XSLR laying: Negative  Palpable tenderness: Tender to palpation the paraspinal musculature lumbar spine right greater than left. FABER: positive right . Sensory change: Gross sensation intact to all lumbar and sacral dermatomes.  Reflexes: 2+ at both patellar tendons, 2+ at achilles tendons, Babinski's downgoing.  Strength at foot  Plantar-flexion: 5/5 Dorsi-flexion: 5/5 Eversion: 5/5 Inversion: 5/5  Leg strength  Quad: 5/5 Hamstring: 5/5 Hip flexor: 5/5 Hip abductors: 5/5  Gait unremarkable.  Osteopathic findings C2 flexed rotated and side bent right C4 flexed rotated and side bent left T9 extended rotated and side bent left L2 flexed rotated and side bent right Sacrum right on right   Impression and Recommendations:     This case required medical decision making of moderate complexity.      Note: This dictation was prepared with Dragon dictation along with smaller phrase technology. Any transcriptional errors that result from this process are unintentional.

## 2017-12-13 ENCOUNTER — Encounter: Payer: Self-pay | Admitting: Family Medicine

## 2017-12-13 ENCOUNTER — Ambulatory Visit: Payer: 59 | Admitting: Family Medicine

## 2017-12-13 VITALS — BP 116/82 | HR 50 | Ht 73.0 in | Wt 201.0 lb

## 2017-12-13 DIAGNOSIS — M999 Biomechanical lesion, unspecified: Secondary | ICD-10-CM | POA: Diagnosis not present

## 2017-12-13 DIAGNOSIS — M6751 Plica syndrome, right knee: Secondary | ICD-10-CM

## 2017-12-13 DIAGNOSIS — M24559 Contracture, unspecified hip: Secondary | ICD-10-CM

## 2017-12-13 NOTE — Patient Instructions (Signed)
Good to see you  Tell your better half hello  Ice is yoru friend.  Exercises 3 times a week.  Warm up on a bike for th eknee  If the knee worsens can consider PT or injections (plica) Call us at [phone] when needed See me again in 6-8 weeks

## 2017-12-13 NOTE — Assessment & Plan Note (Signed)
Decision today to treat with OMT was based on Physical Exam  After verbal consent patient was treated with HVLA, ME, FPR techniques in  thoracic, lumbar and sacral areas  Patient tolerated the procedure well with improvement in symptoms  Patient given exercises, stretches and lifestyle modifications  See medications in patient instructions if given  Patient will follow up in 4 weeks 

## 2017-12-13 NOTE — Assessment & Plan Note (Signed)
Exercise given.  Discussed icing regimen and home exercise.  Follow-up again in 4 weeks

## 2017-12-13 NOTE — Assessment & Plan Note (Signed)
Continue significant tightness of the hip flexors.  Patient is going to continue to work on home exercise, muscle imbalances, continue the vitamin supplementation.  Responded well to manipulation.  Follow-up again in 1 to 3 months

## 2019-12-27 ENCOUNTER — Ambulatory Visit (INDEPENDENT_AMBULATORY_CARE_PROVIDER_SITE_OTHER): Payer: 59

## 2019-12-27 ENCOUNTER — Ambulatory Visit: Payer: Self-pay

## 2019-12-27 ENCOUNTER — Encounter: Payer: Self-pay | Admitting: Family Medicine

## 2019-12-27 ENCOUNTER — Ambulatory Visit: Payer: 59 | Admitting: Family Medicine

## 2019-12-27 ENCOUNTER — Other Ambulatory Visit: Payer: Self-pay

## 2019-12-27 VITALS — BP 124/90 | HR 53 | Ht 73.0 in | Wt 197.0 lb

## 2019-12-27 DIAGNOSIS — M25532 Pain in left wrist: Secondary | ICD-10-CM

## 2019-12-27 NOTE — Patient Instructions (Addendum)
Thank you for coming in today. Plan for xray today and hand PT.   If not better let me know I will arrange for MRI arthrogram.  Use OTC voltaren gel.

## 2019-12-27 NOTE — Progress Notes (Signed)
Shawn Mason, am serving as a Neurosurgeon for Dr. Clementeen Graham.  Shawn Mason is a 34 y.o. male who presents to [facility] today for wrist pain.  He was last seen by Dr. Katrinka Blazing on 12/13/17 for his R knee and hip.  Since his last visit, pt reports .  He locates his pain to L at the base of thumb if applying pressure the top side. Patient states been going on a year but has noticed recently it has been more persistent.   Radiating pain: sometimes up the arm Wrist mechanical symptoms: Wrist swelling: no  Aggravating factors: push ups; bar bells; Treatments tried: resting    Pertinent review of systems: No fevers or chills  Relevant historical information: Does CrossFit.  History of previous tendinopathy   Exam:  BP 124/90 (BP Location: Left Arm, Patient Position: Sitting, Cuff Size: Normal)   Pulse (!) 53   Ht 6\' 1"  (1.854 m)   Wt 197 lb (89.4 kg)   SpO2 99%   BMI 25.99 kg/m  General: Well Developed, well nourished, and in no acute distress.   MSK: Left wrist normal-appearing Not particularly tender. Normal motion some pain with full extension. Normal strength. Pulses cap refill sensation intact distally.  Mildly positive Finkelstein's test.    Lab and Radiology Results  X-ray images left wrist obtained today personally and independently reviewed No acute fractures.  No severe degenerative changes. Await formal radiology review  Diagnostic Limited MSK Ultrasound of: Left wrist Small amount of hypoechoic fluid surrounds first dorsal wrist compartment tendon within tendon sheath consistent with mild de Quervain's tenosynovitis.  Otherwise wrist normal-appearing volar and dorsally. Impression: Probable mild de Quervain's tenosynovitis      Assessment and Plan: 35 y.o. male with left wrist pain ongoing for months.  Possible injury or overuse.  Likely some component of dorsal impingement and some component of de Quervain's tenosynovitis and  flexor carpi radialis tendinitis.  Plan for trial of hand physical therapy.  If not improved in about 6 weeks patient will notify me we will proceed with MRI arthrogram to further characterize cause of pain.    Orders Placed This Encounter  Procedures  . 32 LIMITED JOINT SPACE STRUCTURES UP LEFT    Standing Status:   Future    Number of Occurrences:   1    Standing Expiration Date:   12/26/2020    Order Specific Question:   Reason for Exam (SYMPTOM  OR DIAGNOSIS REQUIRED)    Answer:   Wrist pain    Order Specific Question:   Preferred imaging location?    Answer:   12/28/2020 [facility]  . DG Wrist Complete Left    Standing Status:   Future    Number of Occurrences:   1    Standing Expiration Date:   12/26/2020    Order Specific Question:   Reason for Exam (SYMPTOM  OR DIAGNOSIS REQUIRED)    Answer:   eval wrist pain    Order Specific Question:   Preferred imaging location?    Answer:   12/28/2020    Order Specific Question:   Radiology Contrast Protocol - do NOT remove file path    Answer:   [email or web address]  . Ambulatory referral to Physical Therapy    Referral Priority:   Routine    Referral Type:   Physical Medicine    Referral Reason:   Specialty Services Required    Requested Specialty:  Physical Therapy    Number of Visits Requested:   1   No orders of the defined types were placed in this encounter.    Discussed warning signs or symptoms. Please see discharge instructions. Patient expresses understanding.   The above documentation has been reviewed and is accurate and complete Clementeen Graham, M.D.

## 2019-12-30 NOTE — Progress Notes (Signed)
Wrist x-ray looks normal to radiology

## 2021-04-22 IMAGING — DX DG WRIST COMPLETE 3+V*L*
4 series · 4 of 4 positions shown · non-contrast
Comparison: None.

CLINICAL DATA: Chronic dorsal wrist pain.

EXAM:
LEFT WRIST - COMPLETE 3+ VIEW

[wrist ap]
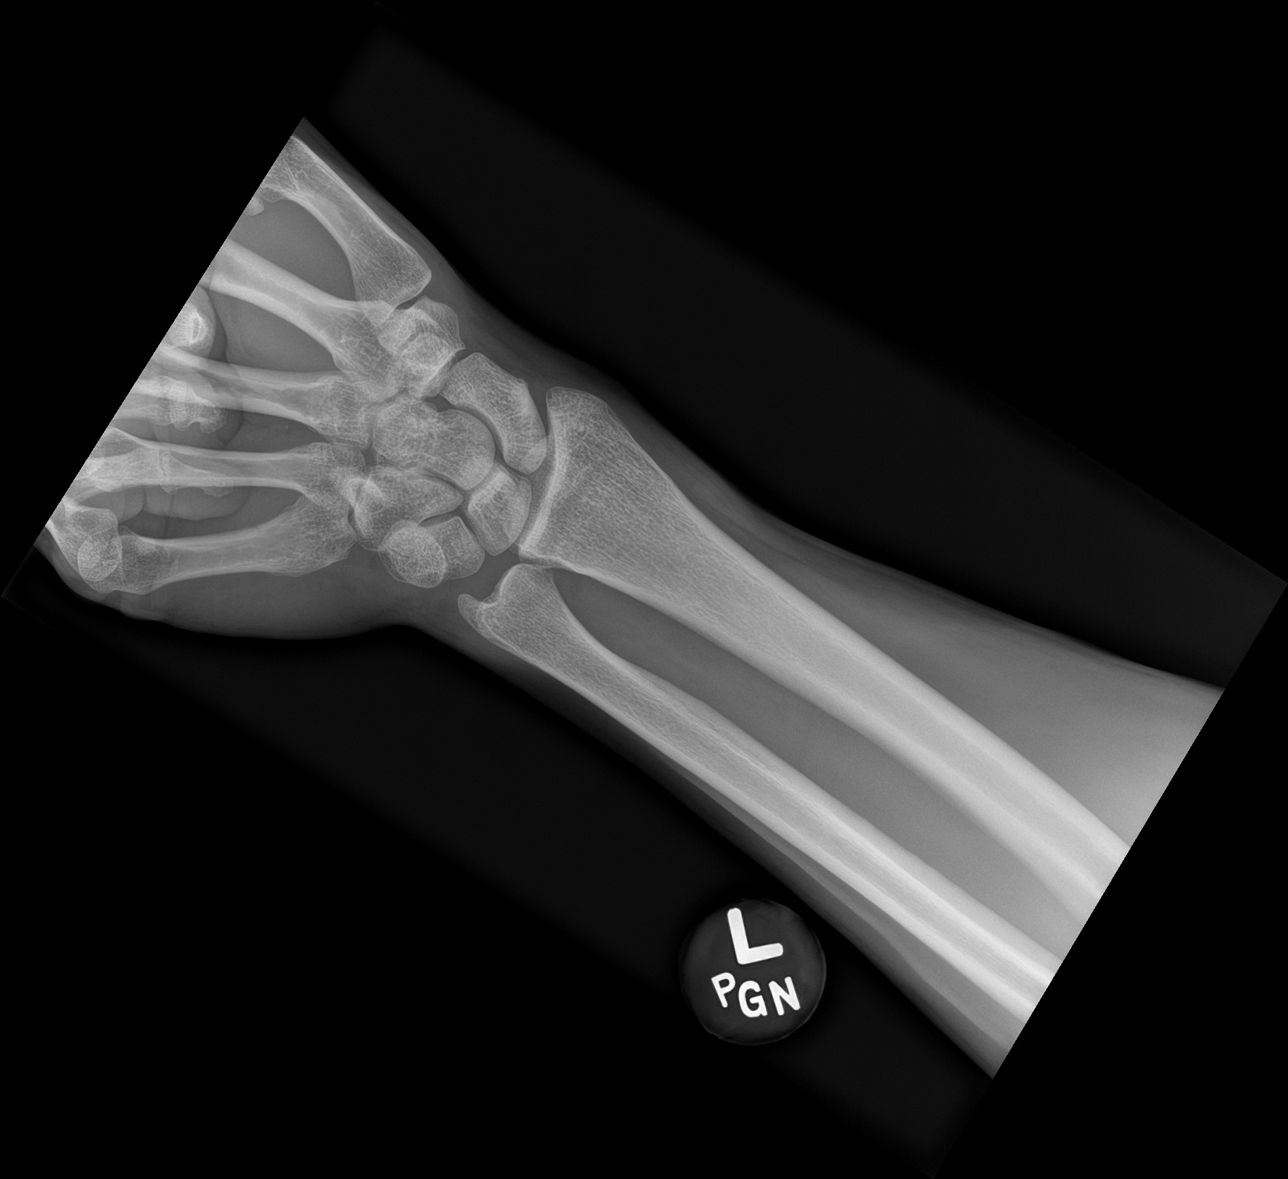

[wrist obl]
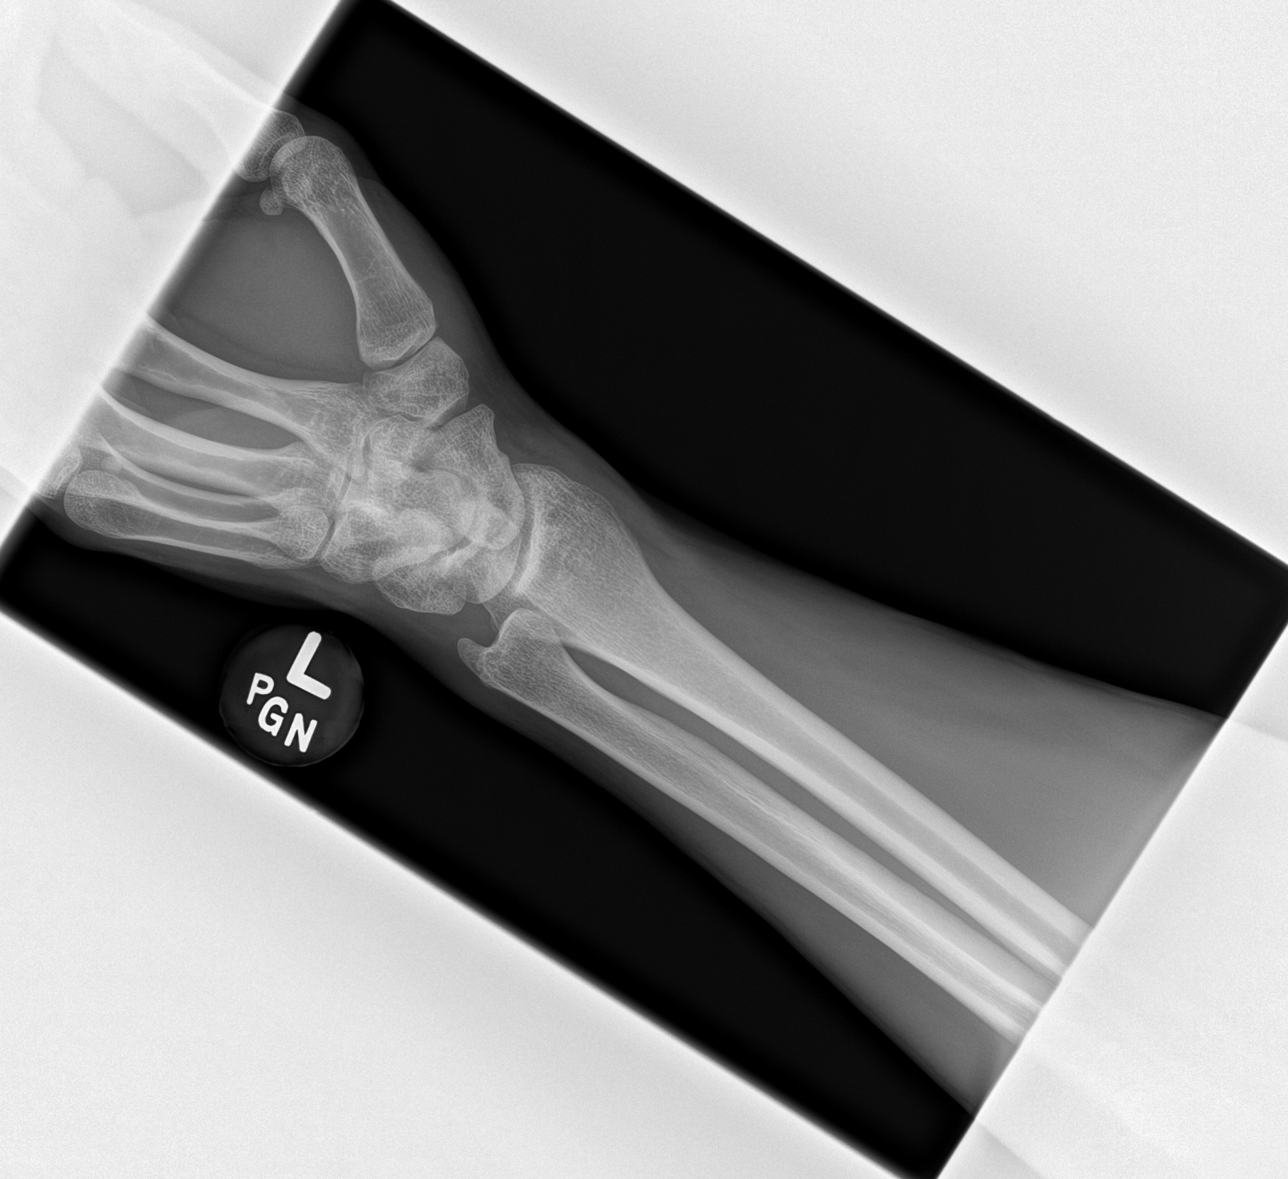

[wrist lat]
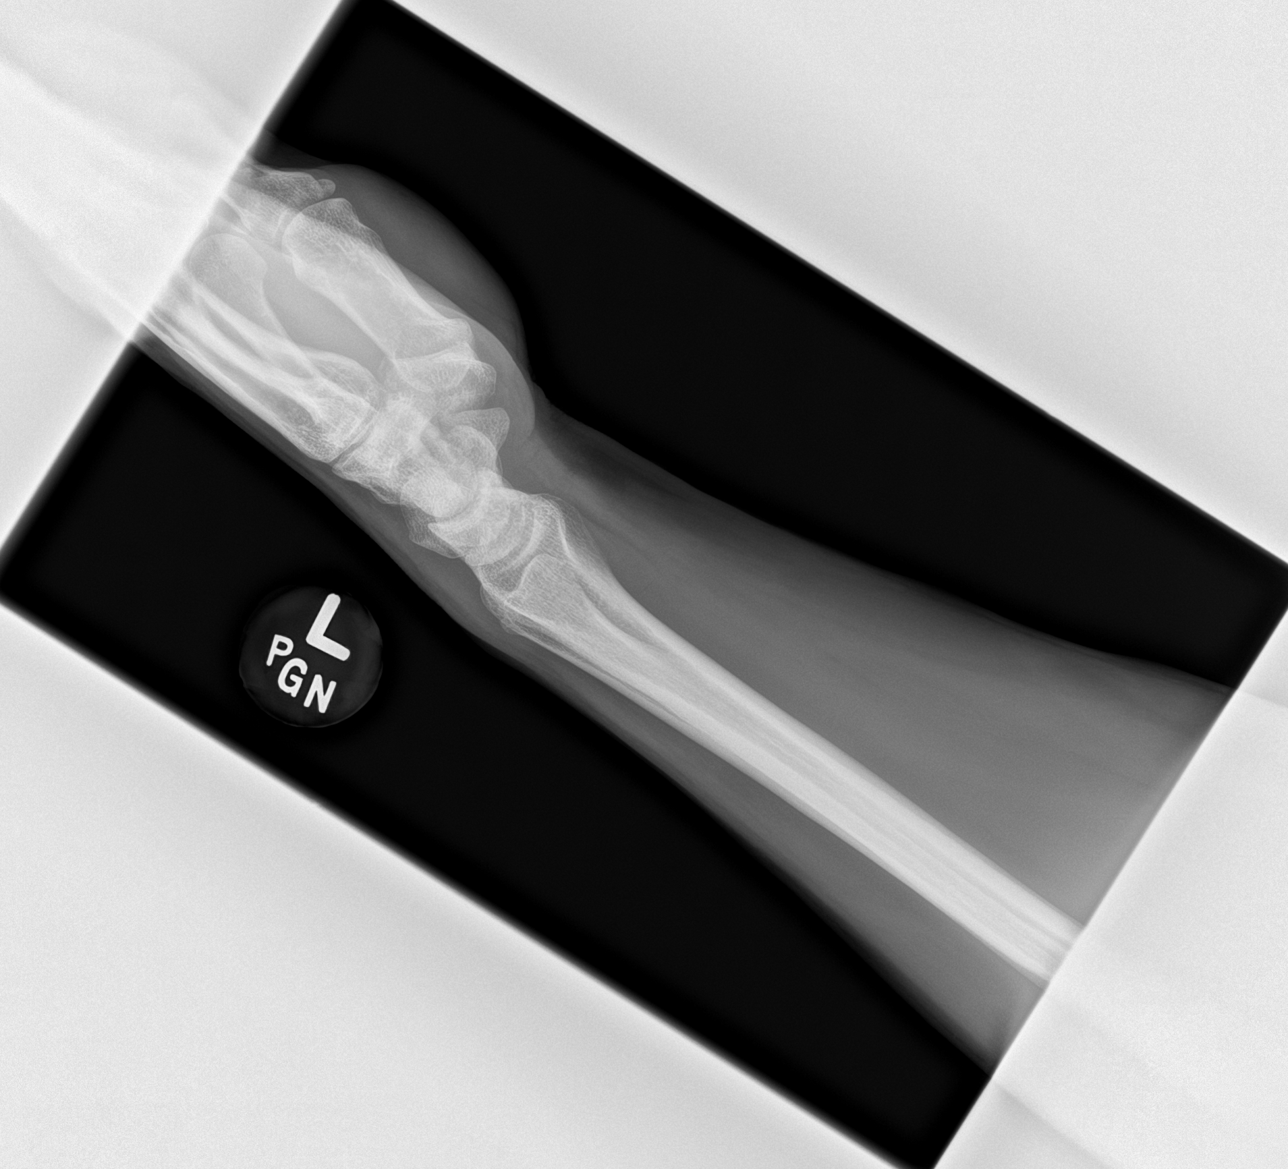

[scaphoid]
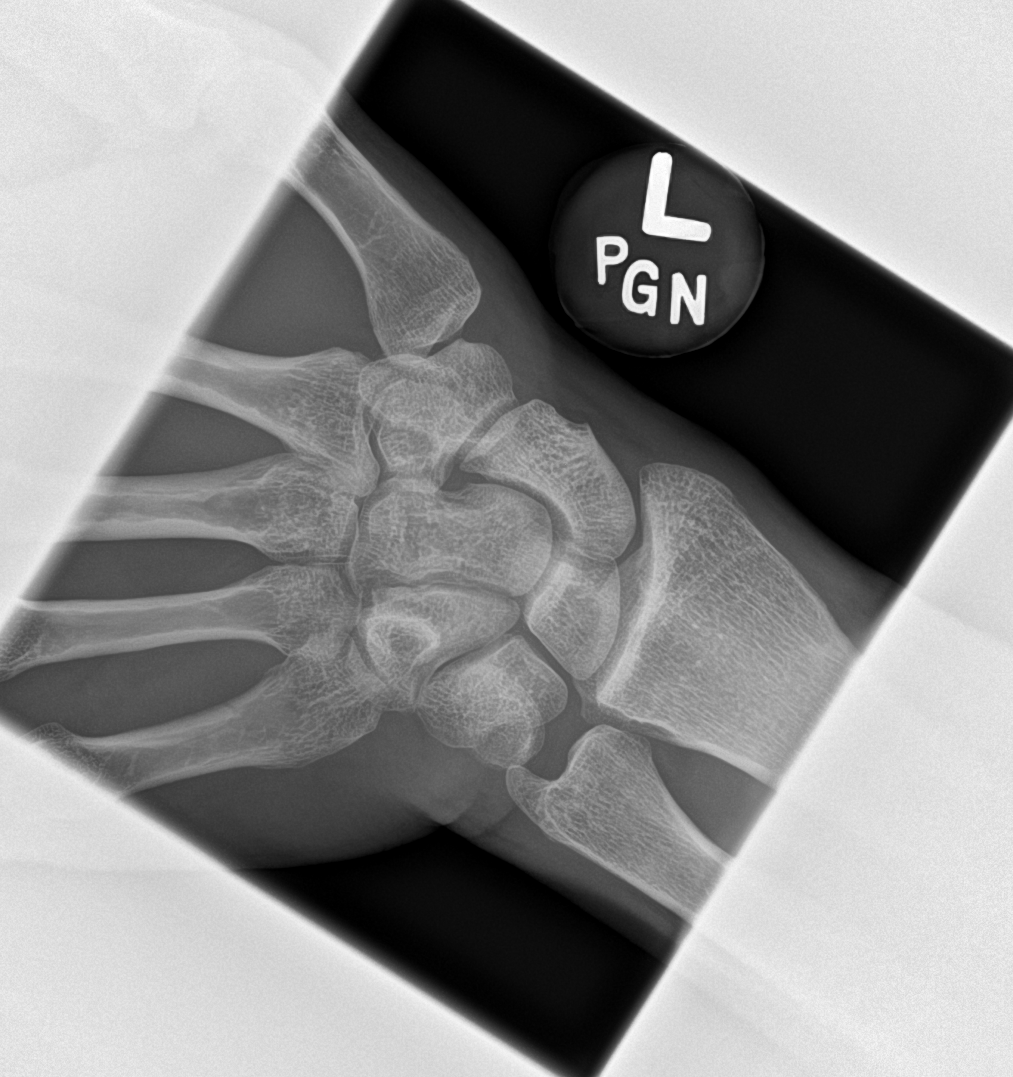

[4 of 4 positions shown; findings below may reference images not displayed]

FINDINGS: The joint spaces are maintained. No degenerative or erosive
findings. No chondrocalcinosis. No acute bony findings.
IMPRESSION: Normal left wrist radiographs.

## 2022-11-08 ENCOUNTER — Ambulatory Visit: Payer: Managed Care, Other (non HMO) | Admitting: Sports Medicine

## 2022-11-08 VITALS — BP 110/80 | HR 50 | Ht 73.0 in | Wt 201.0 lb

## 2022-11-08 DIAGNOSIS — G8929 Other chronic pain: Secondary | ICD-10-CM | POA: Diagnosis not present

## 2022-11-08 DIAGNOSIS — M545 Low back pain, unspecified: Secondary | ICD-10-CM

## 2022-11-08 DIAGNOSIS — M9905 Segmental and somatic dysfunction of pelvic region: Secondary | ICD-10-CM | POA: Diagnosis not present

## 2022-11-08 DIAGNOSIS — M9903 Segmental and somatic dysfunction of lumbar region: Secondary | ICD-10-CM

## 2022-11-08 DIAGNOSIS — M9904 Segmental and somatic dysfunction of sacral region: Secondary | ICD-10-CM | POA: Diagnosis not present

## 2022-11-08 MED ORDER — MELOXICAM 15 MG PO TABS: 15.0000 mg | ORAL_TABLET | Freq: Every day | ORAL | 0 refills | Status: AC

## 2022-11-08 NOTE — Patient Instructions (Addendum)
-   Start meloxicam 15 mg daily x2 weeks.  If still having pain after 2 weeks, complete 3rd-week of meloxicam. May use remaining meloxicam as needed once daily for pain control.  Do not to use additional NSAIDs while taking meloxicam.  May use Tylenol [phone] mg 2 to 3 times a day for breakthrough pain. Low back HEP 3 week follow up

## 2022-11-08 NOTE — Progress Notes (Signed)
Shawn Mason D.[facility] [address] [place] [address] Phone: [phone]   Assessment and Plan:     1. Chronic bilateral low back pain without sciatica 2. Somatic dysfunction of lumbar region 3. Somatic dysfunction of pelvic region 4. Somatic dysfunction of sacral region -Chronic with exacerbation, subsequent visit - Most consistent with recurrent low back muscular strain that was not able to fully heal from prior strain earlier this year.  No red flag symptoms, so no imaging at today's visit - Start HEP for low back and core - Start meloxicam 15 mg daily x2 weeks.  If still having pain after 2 weeks, complete 3rd-week of meloxicam. May use remaining meloxicam as needed once daily for pain control.  Do not to use additional NSAIDs while taking meloxicam.  May use Tylenol [phone] mg 2 to 3 times a day for breakthrough pain. - Patient has received relief with OMT in the past.  Elects for repeat OMT today.  Tolerated well per note below. - Decision today to treat with OMT was based on Physical Exam  After verbal consent patient was treated with HVLA (high velocity low amplitude), ME (muscle energy), FPR (flex positional release), ST (soft tissue), PC/PD (Pelvic Compression/ Pelvic Decompression) techniques in sacrum, lumbar, and pelvic areas. Patient tolerated the procedure well with improvement in symptoms.  Patient educated on potential side effects of soreness and recommended to rest, hydrate, and use Tylenol as needed for pain control.    Pertinent previous records reviewed include none   Follow Up: 3 to 4 weeks for reevaluation.  If no improvement or worsening of symptoms, could consider OMT versus lumbar x-ray versus physical therapy   Subjective:   I, Shawn Mason, am serving as a Neurosurgeon for Doctor Richardean Sale  Chief Complaint: back pain   HPI:   11/08/22 Patient is a 37 year old male complaining of back pain. Patient  states that he hurt his back yesterday morning, he was dumbbell snatches. This happens at least once a year. He had a tweak earlier this year and he doesn't think he recovered from the first tweak. 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 No radiating  pain, ibu and that helps some. When exercises and works on the glutes he can feel temporary relief   Relevant Historical Information: None  Additional pertinent review of systems negative.   Current Outpatient Medications:    meloxicam (MOBIC) 15 MG tablet, Take 1 tablet (15 mg total) by mouth daily., Disp: 30 tablet, Rfl: 0   Objective:     Vitals:   11/08/22 1024  BP: 110/80  Pulse: (!) 50  SpO2: 98%  Weight: 201 lb (91.2 kg)  Height: 6\' 1"  (1.854 m)      Body mass index is 26.52 kg/m.    Physical Exam:    Gen: Appears well, nad, nontoxic and pleasant Psych: Alert and oriented, appropriate mood and affect Neuro: sensation intact, strength is 5/5 in upper and lower extremities, muscle tone wnl Skin: no susupicious lesions or rashes  Back - Normal skin, Spine with normal alignment and no deformity.   No tenderness to vertebral process palpation.   Mild tenderness to bilateral lumbar paraspinal muscles without muscle spasm NTTP gluteal musculature Straight leg raise negative, though left test did worsen nonradiating low back pain Trendelenberg negative Piriformis Test negative     OMT Physical Exam:  ASIS Compression Test: Positive Right sacrum: Positive  sphinx, TTP mildly left sacral base Lumbar: TTP paraspinal, L2 RLSL Pelvis: No significant pelvic dysfunction  Electronically signed by:  Shawn Mason D.[facility] 11:28 AM 11/08/22

## 2022-11-09 ENCOUNTER — Ambulatory Visit: Payer: 59 | Admitting: Family Medicine

## 2022-11-29 ENCOUNTER — Ambulatory Visit: Payer: Self-pay | Admitting: Sports Medicine

## 2023-11-24 ENCOUNTER — Encounter: Payer: Self-pay | Admitting: Advanced Practice Midwife
